# Patient Record
Sex: Female | Born: 1940 | Race: Black or African American | Hispanic: No | Smoking: Former smoker
Health system: Southern US, Community
[De-identification: ages and names within clinical notes are randomized; demographics above are authoritative.]

## PROBLEM LIST (undated history)

## (undated) DIAGNOSIS — M069 Rheumatoid arthritis, unspecified: Secondary | ICD-10-CM

## (undated) DIAGNOSIS — I10 Essential (primary) hypertension: Secondary | ICD-10-CM

## (undated) HISTORY — PX: TOTAL KNEE ARTHROPLASTY: SHX125

## (undated) HISTORY — PX: JOINT REPLACEMENT: SHX530

## (undated) HISTORY — PX: TOTAL HIP ARTHROPLASTY: SHX124

## (undated) HISTORY — PX: TOTAL SHOULDER REPLACEMENT: SUR1217

---

## 2001-09-20 ENCOUNTER — Ambulatory Visit (HOSPITAL_BASED_OUTPATIENT_CLINIC_OR_DEPARTMENT_OTHER): Admission: RE | Admit: 2001-09-20 | Discharge: 2001-09-20 | Payer: Self-pay | Admitting: Orthopedic Surgery

## 2002-05-29 ENCOUNTER — Encounter: Payer: Self-pay | Admitting: Orthopedic Surgery

## 2002-05-31 ENCOUNTER — Encounter: Payer: Self-pay | Admitting: Orthopedic Surgery

## 2002-05-31 ENCOUNTER — Inpatient Hospital Stay (HOSPITAL_COMMUNITY): Admission: RE | Admit: 2002-05-31 | Discharge: 2002-06-03 | Payer: Self-pay | Admitting: Orthopedic Surgery

## 2002-06-03 ENCOUNTER — Inpatient Hospital Stay (HOSPITAL_COMMUNITY)
Admission: RE | Admit: 2002-06-03 | Discharge: 2002-06-15 | Payer: Self-pay | Admitting: Physical Medicine & Rehabilitation

## 2002-06-07 ENCOUNTER — Encounter: Payer: Self-pay | Admitting: Physical Medicine & Rehabilitation

## 2002-06-07 ENCOUNTER — Encounter: Payer: Self-pay | Admitting: Orthopedic Surgery

## 2002-11-20 ENCOUNTER — Inpatient Hospital Stay (HOSPITAL_COMMUNITY): Admission: RE | Admit: 2002-11-20 | Discharge: 2002-11-22 | Payer: Self-pay | Admitting: Orthopedic Surgery

## 2002-11-20 ENCOUNTER — Encounter: Payer: Self-pay | Admitting: Orthopedic Surgery

## 2002-11-22 ENCOUNTER — Inpatient Hospital Stay (HOSPITAL_COMMUNITY)
Admission: RE | Admit: 2002-11-22 | Discharge: 2002-12-03 | Payer: Self-pay | Admitting: Physical Medicine & Rehabilitation

## 2008-08-20 ENCOUNTER — Other Ambulatory Visit: Admission: RE | Admit: 2008-08-20 | Discharge: 2008-08-20 | Payer: Self-pay | Admitting: Internal Medicine

## 2008-10-22 ENCOUNTER — Emergency Department (HOSPITAL_COMMUNITY): Admission: EM | Admit: 2008-10-22 | Discharge: 2008-10-22 | Payer: Self-pay | Admitting: Emergency Medicine

## 2009-01-29 ENCOUNTER — Ambulatory Visit (HOSPITAL_BASED_OUTPATIENT_CLINIC_OR_DEPARTMENT_OTHER): Admission: RE | Admit: 2009-01-29 | Discharge: 2009-01-29 | Payer: Self-pay | Admitting: Orthopedic Surgery

## 2010-05-10 LAB — POCT I-STAT, CHEM 8
BUN: 15 mg/dL (ref 6–23)
Chloride: 107 mEq/L (ref 96–112)
Creatinine, Ser: 0.8 mg/dL (ref 0.4–1.2)
Potassium: 4 mEq/L (ref 3.5–5.1)
Sodium: 143 mEq/L (ref 135–145)

## 2010-05-14 LAB — POCT I-STAT, CHEM 8
BUN: 14 mg/dL (ref 6–23)
Chloride: 108 mEq/L (ref 96–112)
Creatinine, Ser: 1.2 mg/dL (ref 0.4–1.2)
Glucose, Bld: 103 mg/dL — ABNORMAL HIGH (ref 70–99)
Hemoglobin: 12.9 g/dL (ref 12.0–15.0)
Potassium: 4 mEq/L (ref 3.5–5.1)
Sodium: 137 mEq/L (ref 135–145)

## 2010-06-25 NOTE — Discharge Summary (Signed)
Sandra Torres, HITSMAN                            ACCOUNT NO.:  [id number]   MEDICAL RECORD NO.:  [id number]                   PATIENT TYPE:  IPS   LOCATION:  4155                                 FACILITY:  [facility]   PHYSICIAN:  Erick Colace, M.D.           DATE OF BIRTH:  10/21/1940   DATE OF ADMISSION:  06/03/2002  DATE OF DISCHARGE:  06/15/2002                                 DISCHARGE SUMMARY   DISCHARGE DIAGNOSES:  1. 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  2. Iatrogenics femoral shaft fraction.  3. History of rheumatoid arthritis.  4. Anemia.  5. Deep vein thrombosis prophylaxis.   HISTORY OF PRESENT ILLNESS:  The patient is a 70 year old black female with  past history of total hip replacement and RA admitted on 05/31/02 for  revision with ORIF of the femur secondary to loose left total hip  replacement with iatrogenic femoral shaft fracture by Dr. Mckinley Jewel.   She was placed on Coumadin for DVT prophylaxis.  Postoperative complications  includes constipation and anemia status post transfusion.  PT report at this  time indicates she is ambulating five steps with minimal assist and can  transfer with moderate assist.  She is now weightbearing on the left leg.  The patient was transferred to [facility] inpatient rehabilitation  department on 06/03/02.   PAST MEDICAL HISTORY:  Significant for as above, hypertension.   PAST SURGICAL HISTORY:  Left total knee arthroplasty and left total shoulder  placement.   PRIMARY CARE PHYSICIAN:  Areatha Keas, M.D. and Demetria Pore. Levitin, M.D.   SOCIAL HISTORY:  The patient lives in a one level home with niece and 2-3  steps to entry.  Independent prior to admission.  Divorced and retired.  She  has limited family report.  Absent tobacco and alcohol use.   FAMILY HISTORY:  Noncontributory.   HOSPITAL COURSE:  Mrs. Sandra Torres was admitted to [facility]  rehabilitation department on 06/03/02 for  comprehensive inpatient  rehabilitation.  She received more than 3 hours of therapy daily.  1. 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     Overall, the patient made pretty good progress during her 11-day stay in     rehabilitation.  She was discharged at a modified independent level.  The     patient was placed on Coumadin for DVT prophylaxis.  Due to decrease in     hemoglobin and positive stools, Coumadin was discontinued on 06/06/02.     She was placed on Lovenox 40 mg and 30 mg SV q.12h.  She remained on the     Lovenox throughout her entire stay in rehabilitation.  She had venous     Dopplers performed on 06/10/02 that demonstrated no evidence of DVT,     __________ or Baker cyst.  In the first few days of rehabilitation, she     still  had significant blood drainage from the surgery incision.  There     was a question as to whether there was a hematoma at the site.  The     patient was followed throughout by orthopedics, and they recommended     doing a CT of the thigh and pelvis to rule out hematoma and also     recommended do x-rays.  X-rays performed on 06/07/02 demonstrated no acute     abnormality involving the femur.  The patient had pelvic CT performed on     06/07/02 which demonstrated no evidence of pelvic hematoma or free pelvic     fluid.  There are two screws associated with the acetabular compartment     of the left prosthesis which extended to the left iliac muscle.  The     patient complained of significant pain.  She used Oxycodone and Tylenol     as needed.  The patient also was started on OxyContin q.12h. on 4/27 to     help with pain relief.  The patient also received a K-pad to her legs.     The patient also had significant edema in her lower extremities.  The     patient had specific hose ordered.  She had left thigh-high SOBS, 25 mmHg     and that did decrease the swelling in her legs.  Staples were removed and     Steri-Strips were applied on  06/14/02.  At that time, there was decrease in     the drainage and no signs of infection.  2. Anemia.  The patient remained on Trinsicon p.o. b.i.d. throughout her     entire stay in rehabilitation.  Due to hemoglobin of less than 8 on 4/24,     the patient was transfused two units packed red blood cells.  The patient     tolerated the procedure well.  Hemoglobin remained stable throughout     rehab stay.  3. History of rheumatoid arthritis, on methotrexate 7.5 mg p.o. Monday with     prednisone 10 mg p.o. daily.  The patient also occasionally had muscle     spasms, and she received Flexeril 10 mg p.o. q.8h. p.r.n. Flexeril was     discontinued on 06/09/02, and she was discharged on Robaxin.  On Robaxin,     the patient had better relief.  4. History of hypertension.  Blood pressure remained fairly stable     throughout rehabilitation.  No adjustment was made to her Norvasc of 10     mg daily.  5. Other major medical issues occurred in rehabilitation.   LABORATORY DATA:  Latest hemoglobin was 9.9, hematocrit 30.5, white blood  cell count 8.8, platelet 445,000.  The patient had one hemoccult performed  on three stools and there was one positive and one negative.  She remained  on Carafate throughout her entire stay from rehabilitation.  Latest sodium  143, potassium 4.0, chloride 106, CO2 of 29, glucose 88, BUN 9, creatinine  0.9.  AST 35, ALT 23, alkaline phosphatase 62.  Latest urine culture  performed on 06/12/02 20,000 colonies of diphtheroid noted.   At the time of discharge, all vitals were stable.  The patient is ambulating  approximately 125 feet modified independent level, rolling walker,  nonweightbearing on the left.  She is discharged on nonweightbearing status.  She was transfer sit to stand modified independent and in bed mobility  modified independence.  Perform ADL with additional modified independent  level.  The patient met all goals and was discharged home with family.   At the time  of discharge surgical incision without infection, now drainage, no signs of  infection.   DISCHARGE MEDICATIONS:  1. Trinsicon one tablet b.i.d.  2. Prednisone 10 mg daily.  3. Norvasc 10 mg daily.  4. OxyContin 10 mg follow taper.  5. Carafate.  6. Resume on dose of Biaxin 500 one tablet q.6h. as needed for spasm.  7. Oxycodone 5-10 mg q.4-6h. p.r.n. pain.  8. Lovenox injection 40 mg SQ four x4 days.  Have nursing instruct patient     how to administer injections.  9. She will have pain management with Tylenol, Oxycodone and OxyContin.   ACTIVITY:  Nonweightbearing on the left leg.  Use rolling walker.  No  drinking, no driving.  Wear compression stockings when up and walking.  Discharge to [facility] for PT/OT RN.  RN to administer injection  of Lovenox once daily.  Instruct the patient how to administer injection of  Lovenox once daily.  See Dr. Reuel Boom in two weeks.  Follow up with Dr. Phylliss Bob  in four weeks.  Follow up with Dr. Wynn Banker as needed.     Junie Bame, P.A.                       Erick Colace, M.D.    LH/MEDQ  D:  07/09/2002  T:  07/09/2002  Job:  [id number]   cc:   Erick Colace, M.D.  [address] [place]  [place] [address]  Fax: [phone]   Loreta Ave, M.D.  [address]  [place]  [place] [address]  Fax: [phone]   Areatha Keas, M.D.  [address]  [address]  [place]  [place] [address]  Fax: [phone]   Demetria Pore. Coral Spikes, M.D.  [address]  [address]  [place]  [place] [address]  Fax: [phone]

## 2010-06-25 NOTE — Discharge Summary (Signed)
NAMEMELIZZA, Sandra Torres                            ACCOUNT NO.:  [id number]   MEDICAL RECORD NO.:  [id number]                   PATIENT TYPE:  IPS   LOCATION:  4142                                 FACILITY:  [facility]   PHYSICIAN:  Ranelle Oyster, M.D.             DATE OF BIRTH:  12/12/1940   DATE OF ADMISSION:  11/22/2002  DATE OF DISCHARGE:  12/03/2002                                 DISCHARGE SUMMARY   DISCHARGE DIAGNOSES:  1. 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  2. Pain management.  3. Anemia.  4. Coumadin for deep venous thrombosis prophylaxis.  5. Rheumatoid arthritis.  6. Hypertension.  7. History of a left total hip replacement with revision in 2004.  8. Left total knee replacement.   HISTORY OF PRESENT ILLNESS:  A 70 year old female with history of rheumatoid  arthritis admitted November 20, 2002, with end-stage rheumatoid arthritis of  the right knee and no relief with conservative care.  She underwent a right  total knee replacement November 20, 2002, per Dr. Eulah Pont.  Placed on Coumadin  for deep venous thrombosis prophylaxis, weightbearing as tolerated.  Postoperative pain management with intravenous Dilaudid.  No chest pain or  shortness of breath, minimal assist for transfers.  Supervision ambulation.  Latest chemistries unremarkable.  Admitted for a comprehensive rehab  program.   PAST MEDICAL HISTORY:  See discharge diagnoses.   PAST SURGICAL HISTORY:  Left ulnar nerve decompression.  Left shoulder  surgery and multiple joint replacement secondary to rheumatoid arthritis.   PRIMARY CARE PHYSICIAN:  Areatha Keas, M.D.   ALLERGIES:  No known drug allergies.   HABITS:  Remote smoker, no alcohol.   MEDICATIONS PRIOR TO ADMISSION:  1. Methotrexate weekly.  2. Norvasc 10 mg daily.  3. Carafate 1 g twice daily.  4. Prednisone 10 mg daily.  5. Tylox as needed.  6. Multivitamins.   SOCIAL HISTORY:  [place]ives [place]  Independent prior to admission.  One level home, three steps to entry.  Niece works day shift.  Sister is [place], [place].   HOSPITAL COURSE:  The patient did well while in rehabilitation services with  therapies initiated on a b.i.d. basis.  The following issues were followed  during the patient's rehab course.  Pertaining to Ms. Walkup' right total  knee replacement, surgical site healing nicely, no signs of infection,  weightbearing as tolerated.  She would follow up with Dr. Eulah Pont.  She was  ambulating supervision with a walker with home health therapies arranged.  Pain management ongoing with the addition of OxyContin and titrated  accordingly to 30 mg every 12 hours.  This would be tapered on her  discharge.  Anemia remained stable on iron supplement.  Latest hemoglobin  9.4, hematocrit 27.4.  There were no bleeding episodes.  She remained on  Coumadin  for deep venous thrombosis prophylaxis.  Latest INR of 2.9.  She  would complete Coumadin protocol.  Her rheumatoid arthritis was followed by  rheumatologist, Dr. Phylliss Bob.  She continued on prednisone 10 mg daily, as well  as weekly methotrexate.  She had no bowel or bladder disturbances.  Overall,  she was minimal assist supervision level for all areas of activities of  daily living, ambulating with a rolling walker.  She would be discharged to  home.   DISCHARGE MEDICATIONS:  1. Coumadin with latest dose of 4 mg adjusted accordingly to be completed on     December 21, 2002.  2. Norvasc 10 mg daily.  3. Trinsicon twice daily.  4. Methotrexate 7.5 every Monday.  5. Carafate 1 g twice daily.  6. Os-Cal twice daily.  7. Prednisone 10 mg daily.  8. OxyContin continued release 10 mg three tablets every 12 hours x1 week,     taper to two tablets every 12 hours x1 week to one tablet every 12 hours     x1 week and discontinue.  9. Tylox as needed for pain.   ACTIVITY:  Weightbearing as  tolerated.   DIET:  Regular.   WOUND CARE:  Clean incision daily with warm soap and water.    SPECIAL INSTRUCTIONS:  Home health nurse to complete Coumadin protocol.  She  should follow up with Dr. Eulah Pont, orthopedic services, Dr. Phylliss Bob,  rheumatology.      Mariam Dollar, P.A.                     Ranelle Oyster, M.D.    DA/MEDQ  D:  12/02/2002  T:  12/02/2002  Job:  [id number]   cc:   Areatha Keas, M.D.  [address]  [address]  [place]  [place] [address]  Fax: [phone]   Loreta Ave, M.D.  [address]  [place] [address]  Fax: [phone]

## 2010-06-25 NOTE — Op Note (Signed)
NAMELASHENA, SIGNER                            ACCOUNT NO.:  [id number]   MEDICAL RECORD NO.:  [id number]                   PATIENT TYPE:  INP   LOCATION:  2550                                 FACILITY:  [facility]   PHYSICIAN:  Loreta Ave, M.D.              DATE OF BIRTH:  10-25-1940   DATE OF PROCEDURE:  05/31/2002  DATE OF DISCHARGE:                                 OPERATIVE REPORT   PREOPERATIVE DIAGNOSIS:  Severe rheumatoid arthritis with osteoporosis.  Status post left total hip replacement with aseptic loosening and extensive  bone loss on both femoral and acetabular side.   POSTOPERATIVE DIAGNOSIS:  Severe rheumatoid arthritis with extreme  osteoporosis. Status post left total hip replacement with aseptic loosening  and extensive bone loss on both femoral and acetabular side with extreme  osteoporosis.   OPERATION/PROCEDURE:  1. Removal of previous acetabular and femoral implants as well as cement.  2. Revision to an Osteonics prosthesis, 56 mm Trident PSL cup, screw     fixation x2 with acetabular augmentation with allograft femoral head and     platelet enriched cancellous bone, 36 mm internal diameter polyethylene     component, 20-degree overhang.  3. Revision of femur to a fully HA coated restoration femoral stem, size #9     with a 15 mm distal tip, 8-inch, calcar replacing stem, 36 mm metallic     ball plus 0.  4. Cerclage wiring of previously present greater trochanter fracture as well     as a spiral fracture of the femur that occurred during attempted removal     and reinsertion of new prosthesis.  Dall-Miles cables x4.   SURGEON:  Loreta Ave, M.D.   ASSISTANT:  Arlys John D. Petrarca, P.A.-C.   ANESTHESIA:  General.   ESTIMATED BLOOD LOSS:  600 ml.   BLOOD GIVEN:  None.   SPECIMENS:  Excised bone, soft tissue and removed implants.   CULTURES:  None.   COMPLICATIONS:  None.   DRESSING:  Soft compressive with abduction pillow.   DESCRIPTION OF  PROCEDURE:  The patient was brought to the operating room and  placed on the operating table in the supine position.  After adequate  anesthesia had been obtained, turned to a lateral position, left side up.  Prepped and draped in the usual sterile fashion.  An incision along the femur extending posterior superior utilizing her  previous incision and extending this proximally and distally.  Skin and  subcutaneous tissue divided.  The iliotibial band exposed, incised. Charnley  retractor put in place.  Marked, blackened, reactive tissue from metal on  metal wear.  All of this excised.  External rotator and capsule taken down  off the back of the intertrochanteric groove of the femur, preserving this  as much as possible.  The trochanter was already broken but still attached  with soft tissue with the cracking extending down  below the level of the  lesser trochanter.  Bony erosion all the way down to the lesser trochanter  on the femur as well.  After extensive removal of the inflammatory debris,  metal reactive debris and scar tissue, I was able to expose both components  and both components were removed without too much difficulty.  Surrounding  cement in the acetabulum and femur was sequentially removed.  The femur was  extremely osteoporotic and because of loosening and cement reaction, there  were areas where it was essentially tapered thin in the shaft.  Attention  was turned to the acetabulum.  I reamed this up just enlarging it to remove  all of the inflammatory and metallic debris and exposed bleeding bone  throughout.  The medial wall had no bone at all and was just soft tissue.  Superiorly the bone was also very deficient.  Reasonable roof and anterior  column in the relatively deficient posterior column.  Once that had been  prepared and sized approximately for a 56 mm component, attention was turned  to the femur, carefully reaming distally, breaking up the cement plug in all   cement as much as possible.  Eventually I sized for a Tel co replacing an 8-  inch long #9, 15 mm diameter restoration prosthesis.  With this I got good  restoration of her femoral anteversion and good capturing of the prosthesis  distally, confirmed visually as well as with fluoroscopic guidance.  During  one of the attempts of maneuvering and gentle reduction of femur, she  developed a spiral fracture in the area of very thin bone in the shaft from  previous cement disease.  This was treated with three Cerclage wires and  also an additional Cerclage wire was placed over the base of the  trochanteric fracture.  This yielded reasonable fixation of those  components.  The femoral trial was left in the femur and attention was  turned to the acetabulum.  I used cancellous and cortical graft from the  femoral head, packed into the roof of the acetabulum and then injected the  platelet enriched cancellous graft as well.  The 56 mm cup was then hammered  into place with some degree of capturing.  It was further enhanced with two  screws placed through this cup, both 45-50 mm long.  With this I had good  coverage superiorly, anteriorly and at least more than half posteriorly.  The quality of the ball was extremely poor throughout but I did have  reasonable capturing of fixation of the metallic liner.  A 20-degree  polyethylene insert was placed in the over right placed posterior superior.  Attention was turned back to femur.  Completion of the reaming was done.  The trial had been removed and the definitive component was hammered down  and placed in the femur seating it well on the calcar with the calcar  replacing prosthesis, restoring femoral anteversion.  All of the cables had  been firmly tightened down and the combination of the intramedullary implant  and cables to be good fixation and stability.  Further #5 fiber wire sutures were placed through the trochanter and through the prosthesis to  further  anchor the top of the trochanter down to the prosthesis itself.  After  appropriate trials, a plus 0 36 mm head was attached to the femur and it was  reduced in the acetabulum.  Good restoration of leg lengths and not too  tight in extension.  Good stability effect  in that extension.  The wound was  irrigated.  The #5 fiber wire that had been passed through the trochanter  were passed through the hole in the prosthesis and then firmly tied down in  a loop manner to hold the trochanter in place.  The fiber wire suture that  had been placed in the capsule and external rotators was then passed through  drill holes in the trochanter and over tied them to the trochanter as well.  We got final fluoroscopic views of the entire prosthesis, both acetabular  and femur with good alignment, fixation and fitting.  The wound was  irrigated.  Charnley retractor removed.  The iliotibial band was closed with  #1 Vicryl, skin and subcutaneous tissue with Vicryl and staples.  Margins  were injected with Marcaine.  Sterile compressive dressing applied, returned  to the supine position.  Abduction pillow placed.  Anesthesia reversed.  Brought to the recovery room.  Tolerated surgery well.  No complications.                                                 Loreta Ave, M.D.    DFM/MEDQ  D:  05/31/2002  T:  06/02/2002  Job:  [id number]

## 2010-06-25 NOTE — Op Note (Signed)
NAMESHELLI, PORTILLA                            ACCOUNT NO.:  [id number]   MEDICAL RECORD NO.:  [id number]                    PATIENT TYPE:   LOCATION:                                       FACILITY:   PHYSICIAN:  Loreta Ave, M.D.              DATE OF BIRTH:   DATE OF PROCEDURE:  09/20/2001  DATE OF DISCHARGE:                                 OPERATIVE REPORT   PREOPERATIVE DIAGNOSES:  1. Impingement, left shoulder.  2. Underlying rheumatoid arthritis.  3. End-stage degenerative changes, acromioclavicular joint.  4. Possible full-thickness rotator cuff tear.  5. Status post total shoulder replacement.   POSTOPERATIVE DIAGNOSES:  1. Left shoulder impingement with partial tearing, rotator cuff, above and     below.  No full-thickness tears.  2. Status post total shoulder replacement with intact components but with     some mobility and early loosening, glenoid component, but without     significant wear.  3. Grade 4 degenerative joint disease, acromioclavicular joint.  4. Deficient long head biceps tendon.   PROCEDURES:  1. Left shoulder examination under anesthesia, arthroscopy, with debridement     of intra-articular synovitis and inflammatory tissue and partial tearing,     undersurface rotator cuff.  2. Subacromial decompression with acromioplasty.  3. Excision distal clavicle.   SURGEON:  Loreta Ave, M.D.   ASSISTANT:  Arlys John D. Petrarca, P.A.-C.   ANESTHESIA:  General.   ESTIMATED BLOOD LOSS:  Minimal.   SPECIMENS:  None.   CULTURES:  None.   COMPLICATIONS:  None.   DRESSING:  Soft compressive.   DESCRIPTION OF PROCEDURE:  The patient brought to the operating room and  after anesthesia had been obtained, the left shoulder examined.  With the  exception of a little loss of internal and external rotation, she really had  quite good motion and good stability.  Placed in a beach chair position on  the shoulder positioner, prepped and draped in the  usual sterile fashion.  Three standard arthroscopic portals.  The anterior, posterior, lateral.  Shoulder entered with a blunt obturator, distended, and inspected.  Persistent hypertrophic inflammatory changes throughout the shoulder with  rice bodies.  All of this debrided.  Although there was extensive partial  tearing of the rotator cuff, there were no full-thickness tears.  The  humeral component looked excellent.  The glenoid component had some wear and  some mobility, but it was not grossly loose.  The long head of the biceps  tendon was never visualized within the shoulder.  Capsular and ligamentous  structures intact.  All inflammatory debris removed.  Cannula redirected  subacromially.  Typical impingement with a type 2 acromion.  The cuff  debrided and again no full-thickness tears.  Acromioplasty to a type 1  acromion with shaver and high-speed bur.  Hemostasis with cautery.  Distal  clavicle with grade 4 changes with inflammatory changes  there.  Lateral  centimeter was resected.  Adequacy of decompression and clavicle excision  confirmed viewing from all portals.  Instruments and fluid removed.  Portals, shoulder, and bursa injected with Marcaine.  Portals closed with 4-  0 nylon.  Sterile compressive dressing applied.  Anesthesia reversed after a  sling applied.  Brought to the recovery room.  Tolerated the surgery well  with no complications.                                                  Loreta Ave, M.D.    DFM/MEDQ  D:  09/20/2001  T:  09/23/2001  Job:  [id number]

## 2010-06-25 NOTE — Discharge Summary (Signed)
NAMECHEVELLA, Sandra Torres                            ACCOUNT NO.:  [id number]   MEDICAL RECORD NO.:  [id number]                   PATIENT TYPE:  INP   LOCATION:  5023                                 FACILITY:  [facility]   PHYSICIAN:  Loreta Ave, M.D.              DATE OF BIRTH:  [birth year]   DATE OF ADMISSION:  05/31/2002  DATE OF DISCHARGE:  06/03/2002                                 DISCHARGE SUMMARY   ADMISSION DIAGNOSIS:  Failed left total hip replacement.   DISCHARGE DIAGNOSES:  1. Failed left total hip replacement.  2. Iatrogenic fracture of the femur.   PROCEDURE:  Revision of the left total hip with ORIF.   HISTORY:  This 70 year old black female, status post left total hip  replacement in 1986 in [place].  She has been experiencing groin pain with  any activity.  Seen in our office with radiographic evidence of loosening of  the left acetabulum with positional change of the acetabular component.  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  She is  indicated at this time for revision, left total hip replacement.  She does  have a history of rheumatoid arthritis as well as osteoporosis.   HOSPITAL COURSE:  A 70 year old black female admitted May 31, 2002, and  after appropriate laboratory studies were obtained as well as 1 gm of Ancef  IV and called the operator and was taken to the operating room where she  underwent a revision of the left total hip replacement with open reduction  internal fixation of the femur.  She tolerated the procedure well.   She was continued postoperatively on Ancef 1 gm IV q.8h. x4 doses, placed on  heparin 5,000 units subcu q.12h. until Coumadin was therapeutic.   Consultations with PT/OT and rehab were made.  Total hip protocol was  instructed, nonweightbearing on her left leg in physical therapy.   Dr. Phylliss Bob was consulted for care also.   PCA pump was used for post-op pain management.  A Foley was placed  intraoperatively.   She was allowed out of the bed to the chair the following day.   She was typed and crossed for 2 units of packed cells on April 25 and was  given one on April 25 and one on April 26.  The Foley was discontinued on  April 26.   The remainder of her hospital course was uneventful, and she was transferred  to rehab on April 26 to continue with her physical and occupational therapy.   Radiographic studies, May 31, 2002, of the hip reveals good position of  the left total hip prosthesis.  No definite acute fracture.  There is a  slight irregularity around the medial aspect of the cortex of the mid  portion of the femoral shaft component of the prosthesis.  There are also  wire bands in the  upper femur.   Laboratory studies revealed admission hemoglobin of 13.5, hematocrit 40.8%,  white count 11,900, platelets 277,000.  Hemoglobin on April 26 was 7.5 and  hematocrit 21.9% with a white count of 21,500 and platelets of 152,000.  ProTime on April 26 revealed 21.8 with an INR of 2.2.  Pre-op chemistries:  Sodium 137, potassium 3.1, chloride 102, CO2 29, glucose 103, BUN 14,  creatinine 1.2, calcium 9.8; total protein was 7.3, albumin 3.8.  AST  28,  ALT 23, ALP 86 and total bilirubin 0.7.  Discharge chemistries:  Sodium 140,  potassium 3.4, chloride 110, CO2 25, glucose 89, BUN 12, creatinine 0.8,  calcium 8.6.  Urinalysis revealed trace ketones, protein of 30; leukocyte  esterase was small, white cells 3-6, no red cells seen, and there was  calcium oxylate crystals noted.  Blood type was O+, antibody screen  negative.   Transfused 2 units of packed cells during her hospital course.   DISCHARGE INSTRUCTIONS:  She was transferred to rehab in improved condition  on a house diet.  She will continue with her physical and occupational  therapy at that time.       Oris Drone Petrarca, P.A.-C.                Loreta Ave, M.D.    BDP/MEDQ  D:  07/01/2002  T:   07/01/2002  Job:  [id number]

## 2010-06-25 NOTE — Op Note (Signed)
Sandra Torres, Sandra Torres                            ACCOUNT NO.:  [id number]   MEDICAL RECORD NO.:  [id number]                   PATIENT TYPE:  INP   LOCATION:  2550                                 FACILITY:  [facility]   PHYSICIAN:  Loreta Ave, M.D.              DATE OF BIRTH:  1940/05/11   DATE OF PROCEDURE:  11/20/2002  DATE OF DISCHARGE:                                 OPERATIVE REPORT   PREOPERATIVE DIAGNOSIS:  End-stage rheumatoid arthritis, right knee.   POSTOPERATIVE DIAGNOSIS:  End-stage rheumatoid arthritis, right knee, with  underlying osteoporosis.   PROCEDURE:  Right total knee replacement with [employer]ics prosthesis, soft  tissue balancing.  A cemented #7 posterior stabilized femoral component,  cemented #7 tibial component with a 10 mm posterior stabilized Flex insert,  with a cemented recessed 26 mm patellar component.   SURGEON:  Loreta Ave, M.D.   ASSISTANT:  Arlys John D. Petrarca, P.A.-C.   ANESTHESIA:  General.   ESTIMATED BLOOD LOSS:  Minimal.   TOURNIQUET TIME:  One hour.   SPECIMENS:  Bone and soft tissue.   CULTURES:  None.   COMPLICATIONS:  None.   DRESSING:  Soft compressive, knee immobilizer.   DRAINS:  Hemovac x2.   DESCRIPTION OF PROCEDURE:  The patient is brought to the operating room, and  after the adequate anesthesia had been obtained, the right knee was  examined.  Motion was 5 to about 80 degrees.  Stable ligaments.  Alignment  was straight.  The tourniquet applied.  Prepped and draped in the usual  sterile fashion.  Exsanguination with elevation of the Esmarch.  The  tourniquet was inflated to 350 mmHg.  A straight incision above the patella,  down to the tibial tubercle.  A medial arthrotomy.  Hemostasis with cautery.  The remaining thickened chronic synovium was resected throughout.  Remnants  of menisci and cruciate ligaments resected.  The distal femur was exposed.  The intramedullary guide was placed.  A moderate osteoporosis.   The distal  resection 10 mm, set at 5 degrees of valgus.  Sized for a #7 component.  Jigs put in place and definitive cuts made.  The trial put in place and  found to fit well.  The tibia exposed.  The tibial spine removed with a saw.  Intramedullary guide placed.  The proximal cut removing 4.0 mm off the  deepest defect with the 5-degree posterior slope cut.  Sized to a #7  component.  The patella sized and reamed and drilled for a 26 mm component.  The trials put in place throughout.  With the 10 cm insert, extension and  flexion to 120 degrees.  Stable ligaments and reasonable patellofemoral  tracking.  The tibia was marked for rotation and reamed.  All trials were  removed.  Copious irrigation with the pulse irrigating device.  All recesses  examined and all spurs and loose bodies removed.  Cement prepared and placed  on all components which were firmly seated.  The polyethylene attacked to  the tibia.  The knee was reduced.  Once the cement had hardened, the knee  was re-examined.  Good extension and flexion to more than 120 degrees.  Good  patellofemoral tracking and stable ligaments.  A Hemovac was placed and  brought out through a separate stab wound.  The arthrotomy closed with #1  Vicryl sutures.  The skin and subcutaneous tissue were closed with Vicryl  and staples.  The margins of the wound of the knee were injected with  Marcaine.  A sterile compressive dressing was applied.  The tourniquet  deflated and removed.  The knee immobilizer applied.  Anesthesia was  reversed.  The patient was brought to the recovery room.  She tolerated the surgery  well with no complications.                                                Loreta Ave, M.D.    DFM/MEDQ  D:  11/20/2002  T:  11/20/2002  Job:  [id number]

## 2010-06-25 NOTE — Discharge Summary (Signed)
Sandra Torres, Sandra Torres                            ACCOUNT NO.:  [id number]   MEDICAL RECORD NO.:  [id number]                   PATIENT TYPE:  INP   LOCATION:  5010                                 FACILITY:  [facility]   PHYSICIAN:  Loreta Ave, M.D.              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   DISCHARGE DIAGNOSES:  1. 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  2. History of rheumatoid arthritis.  3. History of hypertension.  4. History of osteoporosis.  5. Status post total hip replacement with revision.   PROCEDURE:  Right total knee replacement.   HISTORY:  A 70 year old black female with progressive pain and discomfort of  the right knee secondary to end-stage changes from rheumatoid arthritis.  She is now four months status post revision left total hip replacement, and  her right knee has become quite symptomatic and she would like to consider  total knee replacement.  Because of her failure with conservative treatment  and persistent pain with activities of daily living she is now indicated for  a total knee replacement on the right.   HOSPITAL COURSE:  This 70 year old black female was admitted on November 20, 2002.  After appropriate laboratory studies were obtained as well as 1 g  Ancef IV on call to the operating room, was taken to the operating room  where she underwent a right total knee replacement.  She tolerated the  procedure well.  Continued on Ancef 1 g IV q.8h. x 3 doses.  Heparin 5000  units subcutaneous q.12h. was started until her Coumadin became therapeutic.  She was placed on a low dose Dilaudid PCA pump.  Consultations with PT, OT,  and rehab were made.  She was allowed to ambulate weightbearing as tolerated  on the right.  CPM 0 to 30 degrees for eight  hours per day, increasing daily  by 10 degrees.  She was allowed up bed to chair the following day.  She had  her dressing changed on postoperative day #2, and her Foley was  discontinued.  Hemovac's were pulled.  Her wound was clean and dry.  She did  very well with an unremarkable hospital course and was discharged to rehab  on November 22, 2002.   RADIOGRAPHIC STUDIES:  On November 20, 2002, of the right knee reveals  successful right knee replacement.   LABORATORY DATA:  Admitted with a hemoglobin of 13.8, hematocrit 39.3, white  count 10,700, and platelets 301,000.  Discharge hemoglobin 9.7, hematocrit  29%, white count 13,800, platelets 252,000.  Prothrombin time at the time of  discharge is 14.1, INR of 1.1.  Preoperative chemistries:  Sodium 139,  potassium 3.8, chloride 103, CO2 28, glucose 106,  BUN 14, creatinine 0.9,  calcium 9.9, total protein 6.9, albumin 3.8, AST 31, ALT 23, ALP 103, and  total bilirubin 0.5.  Discharge sodium 141, potassium 4.5, chloride 104, CO2  29, glucose 129, BUN 16, creatinine 1, calcium 8.  Urinalysis revealed 30  protein, trace ketones, one urobilinogen, with small leukocyte esterase with  3 to 6 white cells, rare epithelial.  Hyaline casts were noted as well as  calcium __________ crystals.  No bacteria was seen.  Blood type was O  positive, antibody screen negative.   DISCHARGE INSTRUCTIONS:  1. She will continue with her physical therapy and occupational therapy in     rehab.  She was discharged in improved condition.  2. Follow back in the office in 10 to 14 days for staple removal.   CONDITION ON DISCHARGE:  Discharged in improved condition.      Oris Drone Petrarca, P.A.-C.                Loreta Ave, M.D.    BDP/MEDQ  D:  12/26/2002  T:  12/27/2002  Job:  [id number]

## 2011-03-16 DIAGNOSIS — Z79899 Other long term (current) drug therapy: Secondary | ICD-10-CM | POA: Diagnosis not present

## 2011-03-16 DIAGNOSIS — M069 Rheumatoid arthritis, unspecified: Secondary | ICD-10-CM | POA: Diagnosis not present

## 2011-03-16 DIAGNOSIS — M25549 Pain in joints of unspecified hand: Secondary | ICD-10-CM | POA: Diagnosis not present

## 2011-07-20 DIAGNOSIS — M069 Rheumatoid arthritis, unspecified: Secondary | ICD-10-CM | POA: Diagnosis not present

## 2011-09-23 DIAGNOSIS — E039 Hypothyroidism, unspecified: Secondary | ICD-10-CM | POA: Diagnosis not present

## 2011-09-23 DIAGNOSIS — Z79899 Other long term (current) drug therapy: Secondary | ICD-10-CM | POA: Diagnosis not present

## 2011-09-23 DIAGNOSIS — I10 Essential (primary) hypertension: Secondary | ICD-10-CM | POA: Diagnosis not present

## 2011-09-28 DIAGNOSIS — M069 Rheumatoid arthritis, unspecified: Secondary | ICD-10-CM | POA: Diagnosis not present

## 2011-09-28 DIAGNOSIS — G4700 Insomnia, unspecified: Secondary | ICD-10-CM | POA: Diagnosis not present

## 2011-09-28 DIAGNOSIS — I10 Essential (primary) hypertension: Secondary | ICD-10-CM | POA: Diagnosis not present

## 2011-09-28 DIAGNOSIS — M899 Disorder of bone, unspecified: Secondary | ICD-10-CM | POA: Diagnosis not present

## 2011-11-04 DIAGNOSIS — Z23 Encounter for immunization: Secondary | ICD-10-CM | POA: Diagnosis not present

## 2011-11-22 DIAGNOSIS — M069 Rheumatoid arthritis, unspecified: Secondary | ICD-10-CM | POA: Diagnosis not present

## 2012-01-13 DIAGNOSIS — Z1231 Encounter for screening mammogram for malignant neoplasm of breast: Secondary | ICD-10-CM | POA: Diagnosis not present

## 2012-02-22 DIAGNOSIS — M069 Rheumatoid arthritis, unspecified: Secondary | ICD-10-CM | POA: Diagnosis not present

## 2012-06-03 DIAGNOSIS — L723 Sebaceous cyst: Secondary | ICD-10-CM | POA: Diagnosis not present

## 2012-06-07 DIAGNOSIS — M069 Rheumatoid arthritis, unspecified: Secondary | ICD-10-CM | POA: Diagnosis not present

## 2012-10-11 DIAGNOSIS — R109 Unspecified abdominal pain: Secondary | ICD-10-CM | POA: Diagnosis not present

## 2012-10-11 DIAGNOSIS — R197 Diarrhea, unspecified: Secondary | ICD-10-CM | POA: Diagnosis not present

## 2012-10-15 DIAGNOSIS — M069 Rheumatoid arthritis, unspecified: Secondary | ICD-10-CM | POA: Diagnosis not present

## 2012-10-22 DIAGNOSIS — Z23 Encounter for immunization: Secondary | ICD-10-CM | POA: Diagnosis not present

## 2012-10-22 DIAGNOSIS — R197 Diarrhea, unspecified: Secondary | ICD-10-CM | POA: Diagnosis not present

## 2013-01-17 DIAGNOSIS — Z1231 Encounter for screening mammogram for malignant neoplasm of breast: Secondary | ICD-10-CM | POA: Diagnosis not present

## 2013-02-14 DIAGNOSIS — M069 Rheumatoid arthritis, unspecified: Secondary | ICD-10-CM | POA: Diagnosis not present

## 2013-02-27 ENCOUNTER — Inpatient Hospital Stay (HOSPITAL_COMMUNITY)
Admission: EM | Admit: 2013-02-27 | Discharge: 2013-03-02 | DRG: 153 | Disposition: A | Discharge status: Home / Self Care | Payer: Medicare Other | Attending: Internal Medicine | Admitting: Internal Medicine

## 2013-02-27 ENCOUNTER — Emergency Department (HOSPITAL_COMMUNITY): Payer: Medicare Other

## 2013-02-27 ENCOUNTER — Encounter (HOSPITAL_COMMUNITY): Payer: Self-pay | Admitting: Emergency Medicine

## 2013-02-27 DIAGNOSIS — N179 Acute kidney failure, unspecified: Secondary | ICD-10-CM | POA: Diagnosis not present

## 2013-02-27 DIAGNOSIS — J111 Influenza due to unidentified influenza virus with other respiratory manifestations: Principal | ICD-10-CM | POA: Diagnosis present

## 2013-02-27 DIAGNOSIS — E860 Dehydration: Secondary | ICD-10-CM | POA: Diagnosis not present

## 2013-02-27 DIAGNOSIS — D899 Disorder involving the immune mechanism, unspecified: Secondary | ICD-10-CM | POA: Diagnosis present

## 2013-02-27 DIAGNOSIS — IMO0002 Reserved for concepts with insufficient information to code with codable children: Secondary | ICD-10-CM | POA: Diagnosis not present

## 2013-02-27 DIAGNOSIS — R509 Fever, unspecified: Secondary | ICD-10-CM

## 2013-02-27 DIAGNOSIS — I951 Orthostatic hypotension: Secondary | ICD-10-CM | POA: Diagnosis present

## 2013-02-27 DIAGNOSIS — R918 Other nonspecific abnormal finding of lung field: Secondary | ICD-10-CM | POA: Diagnosis not present

## 2013-02-27 DIAGNOSIS — W19XXXA Unspecified fall, initial encounter: Secondary | ICD-10-CM | POA: Diagnosis present

## 2013-02-27 DIAGNOSIS — R5381 Other malaise: Secondary | ICD-10-CM | POA: Diagnosis present

## 2013-02-27 DIAGNOSIS — M6282 Rhabdomyolysis: Secondary | ICD-10-CM | POA: Diagnosis present

## 2013-02-27 DIAGNOSIS — W010XXA Fall on same level from slipping, tripping and stumbling without subsequent striking against object, initial encounter: Secondary | ICD-10-CM | POA: Diagnosis present

## 2013-02-27 DIAGNOSIS — R5383 Other fatigue: Secondary | ICD-10-CM

## 2013-02-27 DIAGNOSIS — I10 Essential (primary) hypertension: Secondary | ICD-10-CM | POA: Diagnosis present

## 2013-02-27 DIAGNOSIS — S0990XA Unspecified injury of head, initial encounter: Secondary | ICD-10-CM | POA: Diagnosis not present

## 2013-02-27 DIAGNOSIS — R6889 Other general symptoms and signs: Secondary | ICD-10-CM | POA: Diagnosis not present

## 2013-02-27 DIAGNOSIS — M069 Rheumatoid arthritis, unspecified: Secondary | ICD-10-CM | POA: Diagnosis present

## 2013-02-27 DIAGNOSIS — R9389 Abnormal findings on diagnostic imaging of other specified body structures: Secondary | ICD-10-CM

## 2013-02-27 DIAGNOSIS — Y92009 Unspecified place in unspecified non-institutional (private) residence as the place of occurrence of the external cause: Secondary | ICD-10-CM

## 2013-02-27 HISTORY — DX: Rheumatoid arthritis, unspecified: M06.9

## 2013-02-27 LAB — CBC WITH DIFFERENTIAL/PLATELET
BASOS ABS: 0 10*3/uL (ref 0.0–0.1)
BASOS PCT: 0 % (ref 0–1)
EOS PCT: 0 % (ref 0–5)
Eosinophils Absolute: 0 10*3/uL (ref 0.0–0.7)
HCT: 38.4 % (ref 36.0–46.0)
Hemoglobin: 12.8 g/dL (ref 12.0–15.0)
LYMPHS PCT: 10 % — AB (ref 12–46)
Lymphs Abs: 0.8 10*3/uL (ref 0.7–4.0)
MCH: 31.8 pg (ref 26.0–34.0)
MCHC: 33.3 g/dL (ref 30.0–36.0)
MCV: 95.3 fL (ref 78.0–100.0)
Monocytes Absolute: 0.4 10*3/uL (ref 0.1–1.0)
Monocytes Relative: 6 % (ref 3–12)
NEUTROS ABS: 6.3 10*3/uL (ref 1.7–7.7)
Neutrophils Relative %: 84 % — ABNORMAL HIGH (ref 43–77)
PLATELETS: 184 10*3/uL (ref 150–400)
RBC: 4.03 MIL/uL (ref 3.87–5.11)
RDW: 15.2 % (ref 11.5–15.5)
WBC: 7.5 10*3/uL (ref 4.0–10.5)

## 2013-02-27 LAB — URINALYSIS, ROUTINE W REFLEX MICROSCOPIC
Bilirubin Urine: NEGATIVE
GLUCOSE, UA: NEGATIVE mg/dL
LEUKOCYTES UA: NEGATIVE
NITRITE: NEGATIVE
PROTEIN: 100 mg/dL — AB
Specific Gravity, Urine: 1.022 (ref 1.005–1.030)
UROBILINOGEN UA: 0.2 mg/dL (ref 0.0–1.0)
pH: 6 (ref 5.0–8.0)

## 2013-02-27 LAB — BASIC METABOLIC PANEL
BUN: 18 mg/dL (ref 6–23)
CALCIUM: 9.3 mg/dL (ref 8.4–10.5)
CO2: 26 meq/L (ref 19–32)
Chloride: 97 mEq/L (ref 96–112)
Creatinine, Ser: 1.17 mg/dL — ABNORMAL HIGH (ref 0.50–1.10)
GFR calc non Af Amer: 45 mL/min — ABNORMAL LOW (ref >90)
GFR, EST AFRICAN AMERICAN: 52 mL/min — AB (ref >90)
Glucose, Bld: 87 mg/dL (ref 70–99)
Potassium: 4.2 mEq/L (ref 3.7–5.3)
SODIUM: 138 meq/L (ref 137–147)

## 2013-02-27 LAB — URINE MICROSCOPIC-ADD ON

## 2013-02-27 LAB — CK: CK TOTAL: 618 U/L — AB (ref 7–177)

## 2013-02-27 MED ORDER — ACETAMINOPHEN 325 MG PO TABS
650.0000 mg | ORAL_TABLET | Freq: Once | ORAL | Status: AC
  Administered 2013-02-27: 650 mg via ORAL
  Filled 2013-02-27: qty 2

## 2013-02-27 MED ORDER — SODIUM CHLORIDE 0.9 % IV BOLUS (SEPSIS)
1000.0000 mL | Freq: Once | INTRAVENOUS | Status: AC
  Administered 2013-02-28: 1000 mL via INTRAVENOUS

## 2013-02-27 NOTE — ED Notes (Signed)
Went in to review medical history with pt  Pt is unable to answer questions regarding past surgeries, etc  Pt alert to person, place   Pt appears to be having difficulty putting her thoughts into words  EDP notified

## 2013-02-27 NOTE — Progress Notes (Signed)
   CARE MANAGEMENT ED NOTE 02/27/2013  [name], Sandra Torres   Account Number:  [id number]  Date Initiated:  02/27/2013  Documentation initiated by:  Radford Pax  Subjective/Objective Assessment:   Patient presents to Ed post fall.  Also complains of frequent urination and urine having strong odor.     Subjective/Objective Assessment Detail:     Action/Plan:   Action/Plan Detail:   Anticipated DC Date:       Status Recommendation to Physician:   Result of Recommendation:    Other ED Services  Consult Working Plan    DC Planning Services  Other  PCP issues    Choice offered to / List presented to:            Status of service:  Completed, signed off  ED Comments:   ED Comments Detail:  Patient confirms she does not have a pcp but does have Medicare insurance.  EDCM provided patient with a list of pcps who accept Medicare insurance within a 10 nmile radius of her zip code.  Patient thankful for resources.

## 2013-02-27 NOTE — ED Provider Notes (Addendum)
CSN: [id number]     Arrival date & time 02/27/13  1946 History   First MD Initiated Contact with Patient 02/27/13 1951     Chief Complaint  Patient presents with  . Fall   (Consider location/radiation/quality/duration/timing/severity/associated sxs/prior Treatment) Patient is a 73 y.o. female presenting with fall. The history is provided by the patient.  Fall This is a new problem. The current episode started 6 to 12 hours ago. The problem occurs constantly. The problem has not changed since onset.Associated symptoms comments: States her legs gave out today and she fell and was unable to get up till her neighbor came home and found her.  She denies abd pain, HA, chest pain or SOB.  Pt has fever today but did not know she had fever.  She c/o of urinary frequency and strong odor to EMS but denies here.. The symptoms are aggravated by walking. Nothing relieves the symptoms. She has tried nothing for the symptoms. The treatment provided no relief.    Past Medical History  Diagnosis Date  . Rheumatoid arthritis    History reviewed. No pertinent past surgical history. No family history on file. History  Substance Use Topics  . Smoking status: Never Smoker   . Smokeless tobacco: Not on file  . Alcohol Use: No   OB History   Grav Para Term Preterm Abortions TAB SAB Ect Mult Living                 Review of Systems  Constitutional: Positive for fever.  Neurological: Positive for weakness.  Psychiatric/Behavioral: Positive for confusion.  All other systems reviewed and are negative.    Allergies  Review of patient's allergies indicates no known allergies.  Home Medications    BP 164/84  Pulse 98  Temp(Src) 102.9 F (39.4 C) (Oral)  Resp 19  SpO2 98% Physical Exam  Nursing note and vitals reviewed. Constitutional: She is oriented to person, place, and time. She appears well-developed and well-nourished. No distress.  HENT:  Head: Normocephalic and atraumatic.   Mouth/Throat: Oropharynx is clear and moist.  Eyes: Conjunctivae and EOM are normal. Pupils are equal, round, and reactive to light.  Neck: Normal range of motion. Neck supple.  Cardiovascular: Regular rhythm and intact distal pulses.  Tachycardia present.   No murmur heard. Pulmonary/Chest: Effort normal and breath sounds normal. No respiratory distress. She has no wheezes. She has no rales.  Abdominal: Soft. She exhibits no distension. There is no tenderness. There is no rebound and no guarding.  Musculoskeletal: Normal range of motion. She exhibits no edema and no tenderness.  Neurological: She is alert and oriented to person, place, and time. She has normal strength. No sensory deficit.  Pt can move all ext and normal sensation but some confusion on exam.  Will try to answer questions but has trouble finishing answers and does not make complete sense at times  Skin: Skin is warm and dry. No rash noted. No erythema.  Psychiatric: She has a normal mood and affect. Her behavior is normal.    ED Course  Procedures (including critical care time) Labs Review Labs Reviewed  CBC WITH DIFFERENTIAL - Abnormal; Notable for the following:    Neutrophils Relative % 84 (*)    Lymphocytes Relative 10 (*)    All other components within normal limits  BASIC METABOLIC PANEL - Abnormal; Notable for the following:    Creatinine, Ser 1.17 (*)    GFR calc non Af Amer 45 (*)    GFR  calc Af Amer 52 (*)    All other components within normal limits  URINALYSIS, ROUTINE W REFLEX MICROSCOPIC - Abnormal; Notable for the following:    Hgb urine dipstick SMALL (*)    Ketones, ur >80 (*)    Protein, ur 100 (*)    All other components within normal limits  CK - Abnormal; Notable for the following:    Total CK 618 (*)    All other components within normal limits  URINE MICROSCOPIC-ADD ON   Imaging Review Dg Chest 2 View  02/28/2013   CLINICAL DATA:  Fever.  Rheumatoid arthritis.  EXAM: CHEST  2 VIEW   COMPARISON:  None.  FINDINGS: Lateral view degraded by patient arm position. Right glenohumeral joint joint space narrowing. Left shoulder arthroplasty. Midline trachea. Mild cardiomegaly. Right paratracheal soft tissue fullness could be related to prominent great vessels. No pleural effusion or pneumothorax. Left upper lobe nodular density which measures 8 mm and is likely calcified.  IMPRESSION: No acute process or explanation for fever.  Favor scarring in the left upper lobe.  Right paratracheal soft tissue fullness which could be related to prominent great vessels. If there are prior radiographs for comparison, the should be reviewed. If no priors exist, followup with chest radiograph in 3 months is recommended.   Electronically Signed   By: Jeronimo Greaves M.D.   On: 02/28/2013 00:36   Ct Head Wo Contrast  02/28/2013   CLINICAL DATA:  Fall  EXAM: CT HEAD WITHOUT CONTRAST  TECHNIQUE: Contiguous axial images were obtained from the base of the skull through the vertex without intravenous contrast.  COMPARISON:  None available  FINDINGS: Mild age-related cerebral atrophy with moderate chronic microvascular ischemic changes are present. Prominent atherosclerotic calcifications present within the carotid siphons bilaterally.  There is no acute intracranial hemorrhage or infarct. No mass lesion or midline shift. Gray-white matter differentiation is well maintained. Ventricles are normal in size without evidence of hydrocephalus. CSF containing spaces are within normal limits. No extra-axial fluid collection.  The calvarium is intact.  Orbital soft tissues are within normal limits.  Air-fluid levels noted within the bilateral maxillary sinuses as well as the sphenoid sinuses. Scattered mucoperiosteal thickening present within the ethmoidal air cells. No mastoid effusion.  Scalp soft tissues are unremarkable.  IMPRESSION: 1. No acute intracranial abnormality. 2. Mild cerebral atrophy with moderate chronic microvascular  ischemic changes. 3. Air-fluid levels within the maxillary and sphenoid sinuses bilaterally, suggestive of active/acute sinusitis.   Electronically Signed   By: Rise Mu M.D.   On: 02/28/2013 00:16    EKG Interpretation   None       MDM   1. Fever of unknown origin   2. Dehydration   3. Rhabdomyolysis     Patient with a fall today was on the floor for 6 hours or more before she was found. Patient states she was just too weak to get up and she is mildly confused here and has a temperature of 102.9. She is otherwise hemodynamically stable. She did complain earlier that she had increased urination with a strong odor but currently denies any urinary symptoms, abdominal pain headache or other associated symptoms. Patient can move all extremities and low suspicion for stroke at this time however if her urine is normal patient will need a head CT and LP to rule out meningitis. She does have a history of rheumatoid arthritis and she is on methotrexate and prednisone.  CK was 618, CBC and BMP within normal  limits.  Patient given IV fluids and Tylenol.  11:44 PM uA without signs of UTI.  CXR and head CTpending.  12:48 AM CXR and CT neg for acute pathology.  Discussed LP with pt and at this time she is refusing LP.  Mental confusion has resolved now that fever has resolved.  Pt did get flu shot this year and flu pcr pending.  Will admit pt for IVF for dehydration and mild rhabdo.  Gwyneth Sprout, MD 02/28/13 [id number]  Gwyneth Sprout, MD 02/28/13 [phone]

## 2013-02-27 NOTE — ED Notes (Signed)
Per EMS pt fell earlier today and was found by her neighbor  Pt states she has not felt good for the past couple of days  Pt denies injury from fall  Pt states she has had urinary symptoms including increased urination and strong odor

## 2013-02-28 ENCOUNTER — Encounter (HOSPITAL_COMMUNITY): Payer: Self-pay | Admitting: Internal Medicine

## 2013-02-28 DIAGNOSIS — R918 Other nonspecific abnormal finding of lung field: Secondary | ICD-10-CM

## 2013-02-28 DIAGNOSIS — E860 Dehydration: Secondary | ICD-10-CM | POA: Diagnosis present

## 2013-02-28 DIAGNOSIS — M6282 Rhabdomyolysis: Secondary | ICD-10-CM | POA: Diagnosis not present

## 2013-02-28 DIAGNOSIS — I10 Essential (primary) hypertension: Secondary | ICD-10-CM | POA: Diagnosis present

## 2013-02-28 DIAGNOSIS — D899 Disorder involving the immune mechanism, unspecified: Secondary | ICD-10-CM | POA: Diagnosis not present

## 2013-02-28 DIAGNOSIS — R509 Fever, unspecified: Secondary | ICD-10-CM | POA: Diagnosis present

## 2013-02-28 DIAGNOSIS — N179 Acute kidney failure, unspecified: Secondary | ICD-10-CM | POA: Diagnosis not present

## 2013-02-28 DIAGNOSIS — W19XXXA Unspecified fall, initial encounter: Secondary | ICD-10-CM | POA: Diagnosis present

## 2013-02-28 DIAGNOSIS — J111 Influenza due to unidentified influenza virus with other respiratory manifestations: Secondary | ICD-10-CM | POA: Diagnosis not present

## 2013-02-28 DIAGNOSIS — R9389 Abnormal findings on diagnostic imaging of other specified body structures: Secondary | ICD-10-CM | POA: Diagnosis present

## 2013-02-28 DIAGNOSIS — S0990XA Unspecified injury of head, initial encounter: Secondary | ICD-10-CM | POA: Diagnosis not present

## 2013-02-28 LAB — COMPREHENSIVE METABOLIC PANEL
ALBUMIN: 3 g/dL — AB (ref 3.5–5.2)
ALK PHOS: 49 U/L (ref 39–117)
ALT: 21 U/L (ref 0–35)
AST: 49 U/L — ABNORMAL HIGH (ref 0–37)
BILIRUBIN TOTAL: 0.4 mg/dL (ref 0.3–1.2)
BUN: 17 mg/dL (ref 6–23)
CHLORIDE: 101 meq/L (ref 96–112)
CO2: 22 mEq/L (ref 19–32)
CREATININE: 1.08 mg/dL (ref 0.50–1.10)
Calcium: 8.1 mg/dL — ABNORMAL LOW (ref 8.4–10.5)
GFR calc non Af Amer: 49 mL/min — ABNORMAL LOW (ref >90)
GFR, EST AFRICAN AMERICAN: 57 mL/min — AB (ref >90)
GLUCOSE: 104 mg/dL — AB (ref 70–99)
POTASSIUM: 3.5 meq/L — AB (ref 3.7–5.3)
Sodium: 137 mEq/L (ref 137–147)
TOTAL PROTEIN: 6.6 g/dL (ref 6.0–8.3)

## 2013-02-28 LAB — RESPIRATORY VIRUS PANEL
Adenovirus: NOT DETECTED
INFLUENZA A H3: DETECTED — AB
INFLUENZA A: DETECTED — AB
Influenza A H1: NOT DETECTED
Influenza B: NOT DETECTED
METAPNEUMOVIRUS: NOT DETECTED
PARAINFLUENZA 1 A: NOT DETECTED
PARAINFLUENZA 2 A: NOT DETECTED
Parainfluenza 3: NOT DETECTED
RESPIRATORY SYNCYTIAL VIRUS B: NOT DETECTED
Respiratory Syncytial Virus A: NOT DETECTED
Rhinovirus: NOT DETECTED

## 2013-02-28 LAB — CBC
HCT: 35.5 % — ABNORMAL LOW (ref 36.0–46.0)
Hemoglobin: 11.8 g/dL — ABNORMAL LOW (ref 12.0–15.0)
MCH: 32.2 pg (ref 26.0–34.0)
MCHC: 33.2 g/dL (ref 30.0–36.0)
MCV: 96.7 fL (ref 78.0–100.0)
PLATELETS: 162 10*3/uL (ref 150–400)
RBC: 3.67 MIL/uL — AB (ref 3.87–5.11)
RDW: 15.4 % (ref 11.5–15.5)
WBC: 5.6 10*3/uL (ref 4.0–10.5)

## 2013-02-28 LAB — INFLUENZA PANEL BY PCR (TYPE A & B)
H1N1FLUPCR: NOT DETECTED
Influenza A By PCR: POSITIVE — AB
Influenza B By PCR: NEGATIVE

## 2013-02-28 LAB — TROPONIN I: Troponin I: <0.3 ng/mL (ref <0.30)

## 2013-02-28 LAB — HEMOGLOBIN A1C
HEMOGLOBIN A1C: 5.7 % — AB (ref <5.7)
Mean Plasma Glucose: 117 mg/dL — ABNORMAL HIGH (ref <117)

## 2013-02-28 LAB — TSH: TSH: 1.518 u[IU]/mL (ref 0.350–4.500)

## 2013-02-28 LAB — MAGNESIUM: Magnesium: 1.5 mg/dL (ref 1.5–2.5)

## 2013-02-28 LAB — PHOSPHORUS: PHOSPHORUS: 3.2 mg/dL (ref 2.3–4.6)

## 2013-02-28 MED ORDER — DOCUSATE SODIUM 100 MG PO CAPS
100.0000 mg | ORAL_CAPSULE | Freq: Two times a day (BID) | ORAL | Status: DC
  Administered 2013-03-01: 100 mg via ORAL
  Filled 2013-02-28 (×6): qty 1

## 2013-02-28 MED ORDER — HYDROCODONE-ACETAMINOPHEN 10-325 MG PO TABS: 1.0000 | ORAL_TABLET | Freq: Four times a day (QID) | ORAL | Status: DC | PRN

## 2013-02-28 MED ORDER — HYDROCODONE-ACETAMINOPHEN 10-325 MG PO TABS
1.0000 | ORAL_TABLET | Freq: Four times a day (QID) | ORAL | Status: DC
  Administered 2013-02-28: 1 via ORAL
  Filled 2013-02-28 (×2): qty 1

## 2013-02-28 MED ORDER — ONDANSETRON HCL 4 MG PO TABS: 4.0000 mg | ORAL_TABLET | Freq: Four times a day (QID) | ORAL | Status: DC | PRN

## 2013-02-28 MED ORDER — SODIUM CHLORIDE 0.9 % IJ SOLN
3.0000 mL | Freq: Two times a day (BID) | INTRAMUSCULAR | Status: DC
  Administered 2013-03-01 (×2): 3 mL via INTRAVENOUS

## 2013-02-28 MED ORDER — ACETAMINOPHEN 325 MG PO TABS: 650.0000 mg | ORAL_TABLET | Freq: Four times a day (QID) | ORAL | Status: DC | PRN

## 2013-02-28 MED ORDER — ENOXAPARIN SODIUM 40 MG/0.4ML ~~LOC~~ SOLN
40.0000 mg | SUBCUTANEOUS | Status: DC
  Administered 2013-02-28 – 2013-03-01 (×2): 40 mg via SUBCUTANEOUS
  Filled 2013-02-28 (×3): qty 0.4

## 2013-02-28 MED ORDER — ACETAMINOPHEN 650 MG RE SUPP: 650.0000 mg | Freq: Four times a day (QID) | RECTAL | Status: DC | PRN

## 2013-02-28 MED ORDER — GUAIFENESIN ER 600 MG PO TB12
600.0000 mg | ORAL_TABLET | Freq: Two times a day (BID) | ORAL | Status: DC
  Administered 2013-02-28 – 2013-03-02 (×5): 600 mg via ORAL
  Filled 2013-02-28 (×6): qty 1

## 2013-02-28 MED ORDER — GUAIFENESIN-DM 100-10 MG/5ML PO SYRP: 5.0000 mL | ORAL_SOLUTION | ORAL | Status: DC | PRN

## 2013-02-28 MED ORDER — SODIUM CHLORIDE 0.9 % IV SOLN: INTRAVENOUS | Status: AC

## 2013-02-28 MED ORDER — LEVOFLOXACIN 750 MG PO TABS
750.0000 mg | ORAL_TABLET | Freq: Every day | ORAL | Status: DC
  Administered 2013-02-28: 750 mg via ORAL
  Filled 2013-02-28: qty 1

## 2013-02-28 MED ORDER — OSELTAMIVIR PHOSPHATE 75 MG PO CAPS
75.0000 mg | ORAL_CAPSULE | Freq: Two times a day (BID) | ORAL | Status: DC
  Administered 2013-02-28 (×2): 75 mg via ORAL
  Filled 2013-02-28 (×3): qty 1

## 2013-02-28 MED ORDER — OSELTAMIVIR PHOSPHATE 75 MG PO CAPS
75.0000 mg | ORAL_CAPSULE | Freq: Every day | ORAL | Status: DC
  Administered 2013-03-01 – 2013-03-02 (×2): 75 mg via ORAL
  Filled 2013-02-28 (×2): qty 1

## 2013-02-28 MED ORDER — PREDNISONE 1 MG PO TABS
4.0000 mg | ORAL_TABLET | Freq: Every day | ORAL | Status: DC
  Administered 2013-02-28 – 2013-03-02 (×3): 4 mg via ORAL
  Filled 2013-02-28 (×3): qty 4

## 2013-02-28 MED ORDER — LEVOFLOXACIN 750 MG PO TABS
750.0000 mg | ORAL_TABLET | ORAL | Status: DC
  Filled 2013-02-28: qty 1

## 2013-02-28 MED ORDER — ONDANSETRON HCL 4 MG/2ML IJ SOLN: 4.0000 mg | Freq: Four times a day (QID) | INTRAMUSCULAR | Status: DC | PRN

## 2013-02-28 MED ORDER — SODIUM CHLORIDE 0.9 % IV SOLN
INTRAVENOUS | Status: AC
  Administered 2013-02-28 (×2): via INTRAVENOUS

## 2013-02-28 NOTE — H&P (Signed)
PCP: [facility]  Chief Complaint:  fall  HPI: Sandra Torres is a 73 y.o. female   has a past medical history of Rheumatoid arthritis.   Presented with  2 day hx of URI symptoms and fatigue with complaints of runny nose, dry cough. Denies joint pains or sore throat. She has had her flu shot this year. Patient is on daily prednisone and weekly methotrexate for hx of RA. Patient was feeling severely fatigued and has fallen down she never lost consiesness but was unable to get up. Reports bumping her head slightly. Denies stiff neck, headache, meningismus. Patient was found 6 hours later by her neighbor. She was brought to the ER and was found to be febrile up to 103. CT of the head and CXR was negative. Patient was noted to be dehydrated. After getting IVF she states she feels better.  denis any chest pain. Hospitalist called for admission.   Review of Systems:    Pertinent positives include: Fevers, chills, fatigue,  non-productive cough,  Constitutional:  No weight loss, night sweats, weight loss  HEENT:  No headaches, Difficulty swallowing,Tooth/dental problems,Sore throat,  No sneezing, itching, ear ache, nasal congestion, post nasal drip,  Cardio-vascular:  No chest pain, Orthopnea, PND, anasarca, dizziness, palpitations.no Bilateral lower extremity swelling  GI:  No heartburn, indigestion, abdominal pain, nausea, vomiting, diarrhea, change in bowel habits, loss of appetite, melena, blood in stool, hematemesis Resp:  no shortness of breath at rest. No dyspnea on exertion, No excess mucus, no productive cough, No No coughing up of blood.No change in color of mucus.No wheezing. Skin:  no rash or lesions. No jaundice GU:  no dysuria, change in color of urine, no urgency or frequency. No straining to urinate.  No flank pain.  Musculoskeletal:  No joint pain or no joint swelling. No decreased range of motion. No back pain.  Psych:  No change in mood or affect.  No depression or anxiety. No memory loss.  Neuro: no localizing neurological complaints, no tingling, no weakness, no double vision, no gait abnormality, no slurred speech, no confusion  Otherwise ROS are negative except for above, 10 systems were reviewed  Past Medical History: Past Medical History  Diagnosis Date  . Rheumatoid arthritis    Past Surgical History  Procedure Laterality Date  . Joint replacement       Medications: Prior to Admission medications   Medication Sig Start Date End Date Taking? Authorizing Provider  Cholecalciferol (VITAMIN D PO) Take by mouth.   Yes Historical Provider, MD  HYDROcodone-acetaminophen (NORCO) 10-325 MG per tablet Take 1 tablet by mouth every 6 (six) hours. As directed.   Yes Historical Provider, MD  Methotrexate Sodium, PF, 50 MG/2ML SOLN Inject 25 mg as directed once a week.   Yes Historical Provider, MD  Multiple Vitamin (MULTIVITAMIN WITH MINERALS) TABS tablet Take 1 tablet by mouth daily.   Yes Historical Provider, MD  omega-3 acid ethyl esters (LOVAZA) 1 G capsule Take 1 g by mouth daily.   Yes Historical Provider, MD  predniSONE (DELTASONE) 1 MG tablet Take 4 mg by mouth daily.   Yes Historical Provider, MD    Allergies:  No Known Allergies  Social History:  Ambulatory  independently   Lives at home alone   reports that she has never smoked. She does not have any smokeless tobacco history on file. She reports that she does not drink alcohol or use illicit drugs.   Family History: family history is negative for  Hypertension, Heart disease, Stroke, and Cancer.    Physical Exam: Patient Vitals for the past 24 hrs:  BP Temp Temp src Pulse Resp SpO2  02/28/13 0012 160/85 mmHg 99.6 F (37.6 C) Oral 94 18 95 %  02/27/13 1950 164/84 mmHg 102.9 F (39.4 C) Oral 98 19 98 %  02/27/13 1946 - - - - - 100 %    1. General:  in No Acute distress 2. Psychological: Alert and   Oriented 3. Head/ENT:     Dry Mucous Membranes                           Head Non traumatic, neck supple                          Normal  Dentition 4. SKIN: decreased Skin turgor,  Skin clean Dry and intact no rash 5. Heart: Regular rate and rhythm no Murmur, Rub or gallop 6. Lungs: Clear to auscultation bilaterally, no wheezes or crackles   7. Abdomen: Soft, non-tender, Non distended 8. Lower extremities: no clubbing, cyanosis, or edema 9. Neurologically Grossly intact, moving all 4 extremities equally 10. MSK: Normal range of motion, deformity of hand joints consistent with rheumatoid arthritis  body mass index is unknown because there is no height or weight on file.   Labs on Admission:   Recent Labs  02/27/13 2040  NA 138  K 4.2  CL 97  CO2 26  GLUCOSE 87  BUN 18  CREATININE 1.17*  CALCIUM 9.3   No results found for this basename: AST, ALT, ALKPHOS, BILITOT, PROT, ALBUMIN,  in the last 72 hours No results found for this basename: LIPASE, AMYLASE,  in the last 72 hours  Recent Labs  02/27/13 2040  WBC 7.5  NEUTROABS 6.3  HGB 12.8  HCT 38.4  MCV 95.3  PLT 184    Recent Labs  02/27/13 2040  CKTOTAL 618*   No results found for this basename: TSH, T4TOTAL, FREET3, T3FREE, THYROIDAB,  in the last 72 hours No results found for this basename: VITAMINB12, FOLATE, FERRITIN, TIBC, IRON, RETICCTPCT,  in the last 72 hours No results found for this basename: HGBA1C    CrCl is unknown because there is no height on file for the current visit. ABG    Component Value Date/Time   TCO2 26 01/29/2009 1023     No results found for this basename: DDIMER    UA no sign of infection   Cultures: No results found for this basename: sdes, specrequest, cult, reptstatus       Radiological Exams on Admission: Dg Chest 2 View  02/28/2013   CLINICAL DATA:  Fever.  Rheumatoid arthritis.  EXAM: CHEST  2 VIEW  COMPARISON:  None.  FINDINGS: Lateral view degraded by patient arm position. Right glenohumeral joint joint space narrowing. Left  shoulder arthroplasty. Midline trachea. Mild cardiomegaly. Right paratracheal soft tissue fullness could be related to prominent great vessels. No pleural effusion or pneumothorax. Left upper lobe nodular density which measures 8 mm and is likely calcified.  IMPRESSION: No acute process or explanation for fever.  Favor scarring in the left upper lobe.  Right paratracheal soft tissue fullness which could be related to prominent great vessels. If there are prior radiographs for comparison, the should be reviewed. If no priors exist, followup with chest radiograph in 3 months is recommended.   Electronically Signed   By: Ronaldo Miyamoto  Reche Dixon M.D.   On: 02/28/2013 00:36   Ct Head Wo Contrast  02/28/2013   CLINICAL DATA:  Fall  EXAM: CT HEAD WITHOUT CONTRAST  TECHNIQUE: Contiguous axial images were obtained from the base of the skull through the vertex without intravenous contrast.  COMPARISON:  None available  FINDINGS: Mild age-related cerebral atrophy with moderate chronic microvascular ischemic changes are present. Prominent atherosclerotic calcifications present within the carotid siphons bilaterally.  There is no acute intracranial hemorrhage or infarct. No mass lesion or midline shift. Gray-white matter differentiation is well maintained. Ventricles are normal in size without evidence of hydrocephalus. CSF containing spaces are within normal limits. No extra-axial fluid collection.  The calvarium is intact.  Orbital soft tissues are within normal limits.  Air-fluid levels noted within the bilateral maxillary sinuses as well as the sphenoid sinuses. Scattered mucoperiosteal thickening present within the ethmoidal air cells. No mastoid effusion.  Scalp soft tissues are unremarkable.  IMPRESSION: 1. No acute intracranial abnormality. 2. Mild cerebral atrophy with moderate chronic microvascular ischemic changes. 3. Air-fluid levels within the maxillary and sphenoid sinuses bilaterally, suggestive of active/acute sinusitis.    Electronically Signed   By: Rise Mu M.D.   On: 02/28/2013 00:16    Chart has been reviewed  Assessment/Plan  This is a 73 year old female with past medical history of rheumatoid arthritis with febrile illness resulting in dehydration and fall  Present on Admission:  . Febrile illness, acute - given the patient immunocompromised will admit him and evaluated father. Obtain blood cultures urine cultures currently pending. Chest x-ray did not show any evidence of pneumonia. Given cough Will, for now with Levaquin. Given high fever and cough Will test for influenza for now cover with Tamiflu. Watch for signs of sepsis at this point patient is hemodynamically stable  . Hypertension - patient is not on any home medications for treatment hypertension we'll hold off on initiation of medication well patient's febrile and been evaluated for early sepsis  . Fall - secondary to generalized fatigue and weakness does not appear to have any localized neurological deficits. Patient is feeling back to baseline  . Dehydration - administer IV fluids and reassess orthostatics  . Abnormal CXR - patient's chest x-ray worrisome for either vascular prominence versus nodularity. Once patient's dehydration has been treated with get a repeat imaging she may benefit from a CT of the chest with contrast at a later time perhaps as an outpatient    Prophylaxis:  Lovenox   CODE STATUS: FULL CODE  Other plan as per orders.  I have spent a total of 55 min on this admission  Sandra Torres 02/28/2013, 1:21 AM

## 2013-02-28 NOTE — Progress Notes (Signed)
Nutrition Brief Note  Patient identified on the Malnutrition Screening Tool (MST) Report  Wt Readings from Last 15 Encounters:  02/28/13 139 lb 3.2 oz (63.141 kg)    Body mass index is 23.88 kg/(m^2). Patient meets criteria for Normal weight based on current BMI.   Pt reported 3 days of 0% PO intake d/t fall and inability to acquire foods. Denied any changes in weight or appetite prior to fall. Appetite was minimal during first meal of admit, but is improving today with breakfast. Denied n/v/dysphagia  Current diet order is regular, patient is consuming approximately 50-75% of meals at this time. Labs and medications reviewed.    No nutrition interventions warranted at this time. If nutrition issues arise, please consult RD.   Lloyd Huger MS RD LDN Clinical Dietitian [phone]

## 2013-02-28 NOTE — ED Notes (Signed)
Hospitalist at bedside 

## 2013-02-28 NOTE — Progress Notes (Signed)
[facility] PROGRESS NOTE  Sandra Torres:[id number] DOB: May 16, 1940 DOA: 02/27/2013 PCP: No primary provider on file.  Assessment/Plan: Dehydration/orthostatic hypotension -continue IVF AKI -pt had serum creatinine 0.8 on 01/29/09 -improving with IVF Influenza like illness -follow up blood culture -UA neg for pyuria -continue levofloxacin pending culture data -follow up influenza PCR -Continue empiric Tamiflu HTN -Patient does not recall the antihypertensive medication she takes -Blood pressure remains stable off of antihypertensives -I have asked the patient to bring in her bottles of medications Rheumatoid arthritis -Patient last received methotrexate on 02/24/2013 -Continue prednisone 4 mg daily Abnormal chest x-ray -Suggests left upper lobe nodularity -May benefit from CT chest once medically stable -?atypical pneumonia vs changes from RA Generalized weakness with mechanical fall -PT eval -CK 618 likely from fall -CT brain shows A/F levels in maxillary and sphenoid sinuses   Family Communication:   Pt at beside Disposition Plan:   Home when medically stable  Antibiotics:  Levofloxacin 02/28/2013    Procedures/Studies: Dg Chest 2 View  02/28/2013   CLINICAL DATA:  Fever.  Rheumatoid arthritis.  EXAM: CHEST  2 VIEW  COMPARISON:  None.  FINDINGS: Lateral view degraded by patient arm position. Right glenohumeral joint joint space narrowing. Left shoulder arthroplasty. Midline trachea. Mild cardiomegaly. Right paratracheal soft tissue fullness could be related to prominent great vessels. No pleural effusion or pneumothorax. Left upper lobe nodular density which measures 8 mm and is likely calcified.  IMPRESSION: No acute process or explanation for fever.  Favor scarring in the left upper lobe.  Right paratracheal soft tissue fullness which could be related to prominent great vessels. If there are prior radiographs for comparison, the should be reviewed. If no  priors exist, followup with chest radiograph in 3 months is recommended.   Electronically Signed   By: Jeronimo Greaves M.D.   On: 02/28/2013 00:36   Ct Head Wo Contrast  02/28/2013   CLINICAL DATA:  Fall  EXAM: CT HEAD WITHOUT CONTRAST  TECHNIQUE: Contiguous axial images were obtained from the base of the skull through the vertex without intravenous contrast.  COMPARISON:  None available  FINDINGS: Mild age-related cerebral atrophy with moderate chronic microvascular ischemic changes are present. Prominent atherosclerotic calcifications present within the carotid siphons bilaterally.  There is no acute intracranial hemorrhage or infarct. No mass lesion or midline shift. Gray-white matter differentiation is well maintained. Ventricles are normal in size without evidence of hydrocephalus. CSF containing spaces are within normal limits. No extra-axial fluid collection.  The calvarium is intact.  Orbital soft tissues are within normal limits.  Air-fluid levels noted within the bilateral maxillary sinuses as well as the sphenoid sinuses. Scattered mucoperiosteal thickening present within the ethmoidal air cells. No mastoid effusion.  Scalp soft tissues are unremarkable.  IMPRESSION: 1. No acute intracranial abnormality. 2. Mild cerebral atrophy with moderate chronic microvascular ischemic changes. 3. Air-fluid levels within the maxillary and sphenoid sinuses bilaterally, suggestive of active/acute sinusitis.   Electronically Signed   By: Rise Mu M.D.   On: 02/28/2013 00:16         Subjective: Patient feeling a little stronger today. Denies any chest pain, shortness breath, nausea, vomiting, diarrhea, abdominal pain, dysuria. She still has a cough without hemoptysis  Objective: Filed Vitals:   02/28/13 0315 02/28/13 0318 02/28/13 0325 02/28/13 0552  BP: 148/83 138/91 129/91 135/82  Pulse: 88 96 109 89  Temp: 99.4 F (37.4 C)   99.1 F (37.3 C)  TempSrc: Oral   Oral  Resp: 20   18  Height:       Weight:      SpO2: 100%   100%    Intake/Output Summary (Last 24 hours) at 02/28/13 1305 Last data filed at 02/28/13 1201  Gross per 24 hour  Intake    360 ml  Output    400 ml  Net    -40 ml   Weight change:  Exam:   General:  Pt is alert, follows commands appropriately, not in acute distress  HEENT: No icterus, No thrush, Zeba/AT  Cardiovascular: RRR, S1/S2, no rubs, no gallops  Respiratory: Bibasilar rales, left greater than right. No wheezing. Good air movement.  Abdomen: Soft/+BS, non tender, non distended, no guarding  Extremities: 1+ LE edema, No lymphangitis, No petechiae, No rashes, no synovitis  Data Reviewed: Basic Metabolic Panel:  Recent Labs Lab 02/27/13 2040 02/28/13 0435  NA 138 137  K 4.2 3.5*  CL 97 101  CO2 26 22  GLUCOSE 87 104*  BUN 18 17  CREATININE 1.17* 1.08  CALCIUM 9.3 8.1*  MG  --  1.5  PHOS  --  3.2   Liver Function Tests:  Recent Labs Lab 02/28/13 0435  AST 49*  ALT 21  ALKPHOS 49  BILITOT 0.4  PROT 6.6  ALBUMIN 3.0*   No results found for this basename: LIPASE, AMYLASE,  in the last 168 hours No results found for this basename: AMMONIA,  in the last 168 hours CBC:  Recent Labs Lab 02/27/13 2040 02/28/13 0435  WBC 7.5 5.6  NEUTROABS 6.3  --   HGB 12.8 11.8*  HCT 38.4 35.5*  MCV 95.3 96.7  PLT 184 162   Cardiac Enzymes:  Recent Labs Lab 02/27/13 2040 02/28/13 0435 02/28/13 1000  CKTOTAL 618*  --   --   TROPONINI  --  <0.30 <0.30   BNP: No components found with this basename: POCBNP,  CBG: No results found for this basename: GLUCAP,  in the last 168 hours  No results found for this or any previous visit (from the past 240 hour(s)).   Scheduled Meds: . sodium chloride   Intravenous STAT  . docusate sodium  100 mg Oral BID  . enoxaparin (LOVENOX) injection  40 mg Subcutaneous Q24H  . guaiFENesin  600 mg Oral BID  . HYDROcodone-acetaminophen  1 tablet Oral Q6H  . [START ON 03/02/2013]  levofloxacin  750 mg Oral Q48H  . [START ON 03/01/2013] oseltamivir  75 mg Oral Daily  . predniSONE  4 mg Oral Daily  . sodium chloride  3 mL Intravenous Q12H   Continuous Infusions:    Merrill Villarruel, DO  [facility] Pager [phone]  If 7PM-7AM, please contact night-coverage [email or web address] Password [email or web address] 02/28/2013, 1:05 PM   LOS: 1 day

## 2013-02-28 NOTE — ED Notes (Signed)
Patient transported to CT 

## 2013-03-01 DIAGNOSIS — I10 Essential (primary) hypertension: Secondary | ICD-10-CM | POA: Diagnosis not present

## 2013-03-01 DIAGNOSIS — J111 Influenza due to unidentified influenza virus with other respiratory manifestations: Secondary | ICD-10-CM | POA: Diagnosis not present

## 2013-03-01 DIAGNOSIS — E860 Dehydration: Secondary | ICD-10-CM | POA: Diagnosis not present

## 2013-03-01 LAB — BASIC METABOLIC PANEL
BUN: 9 mg/dL (ref 6–23)
CALCIUM: 8.3 mg/dL — AB (ref 8.4–10.5)
CO2: 22 meq/L (ref 19–32)
CREATININE: 0.82 mg/dL (ref 0.50–1.10)
Chloride: 102 mEq/L (ref 96–112)
GFR calc Af Amer: 80 mL/min — ABNORMAL LOW (ref >90)
GFR calc non Af Amer: 69 mL/min — ABNORMAL LOW (ref >90)
GLUCOSE: 103 mg/dL — AB (ref 70–99)
Potassium: 3.5 mEq/L — ABNORMAL LOW (ref 3.7–5.3)
Sodium: 137 mEq/L (ref 137–147)

## 2013-03-01 LAB — MAGNESIUM: MAGNESIUM: 1.4 mg/dL — AB (ref 1.5–2.5)

## 2013-03-01 MED ORDER — MAGNESIUM SULFATE 40 MG/ML IJ SOLN
2.0000 g | Freq: Once | INTRAMUSCULAR | Status: AC
  Administered 2013-03-01: 2 g via INTRAVENOUS
  Filled 2013-03-01: qty 50

## 2013-03-01 MED ORDER — BISOPROLOL FUMARATE 5 MG PO TABS
2.5000 mg | ORAL_TABLET | Freq: Every day | ORAL | Status: DC
  Administered 2013-03-01 – 2013-03-02 (×2): 2.5 mg via ORAL
  Filled 2013-03-01 (×2): qty 0.5

## 2013-03-01 MED ORDER — LEVOFLOXACIN 750 MG PO TABS
750.0000 mg | ORAL_TABLET | Freq: Every day | ORAL | Status: DC
  Administered 2013-03-01 – 2013-03-02 (×2): 750 mg via ORAL
  Filled 2013-03-01 (×2): qty 1

## 2013-03-01 NOTE — Progress Notes (Signed)
[facility] PROGRESS NOTE  Sandra Torres BHA:[id number] DOB: 01/01/1941 DOA: 02/27/2013 PCP: No primary provider on file.  Assessment/Plan: Dehydration/orthostatic hypotension  -improved with IVF  AKI  -pt had serum creatinine 0.8 on 01/29/09  -improving with IVF  Influenza -follow up blood culture--neg to date  -UA neg for pyuria  -continue levofloxacin for sinusitis -influenza PCR--positive  -Continue empiric Tamiflu  HTN  -restart bisoprolol   Rheumatoid arthritis  -Patient last received methotrexate on 02/24/2013  -Continue prednisone 4 mg daily  Abnormal chest x-ray  -Suggests left upper lobe nodularity  -May benefit from CT chest once medically stable  -?atypical pneumonia vs changes from RA  Generalized weakness with mechanical fall  -PT eval  -CK 618 likely from fall  -CT brain shows A/F levels in maxillary and sphenoid sinuses  Family Communication: Pt at beside  Disposition Plan: Home 03/02/13 if stable Antibiotics:  Levofloxacin 02/28/2013 Tamiflu 02/28/13>>>        Procedures/Studies: Dg Chest 2 View  02/28/2013   CLINICAL DATA:  Fever.  Rheumatoid arthritis.  EXAM: CHEST  2 VIEW  COMPARISON:  None.  FINDINGS: Lateral view degraded by patient arm position. Right glenohumeral joint joint space narrowing. Left shoulder arthroplasty. Midline trachea. Mild cardiomegaly. Right paratracheal soft tissue fullness could be related to prominent great vessels. No pleural effusion or pneumothorax. Left upper lobe nodular density which measures 8 mm and is likely calcified.  IMPRESSION: No acute process or explanation for fever.  Favor scarring in the left upper lobe.  Right paratracheal soft tissue fullness which could be related to prominent great vessels. If there are prior radiographs for comparison, the should be reviewed. If no priors exist, followup with chest radiograph in 3 months is recommended.   Electronically Signed   By: Abigail Miyamoto M.D.   On: 02/28/2013  00:36   Ct Head Wo Contrast  02/28/2013   CLINICAL DATA:  Fall  EXAM: CT HEAD WITHOUT CONTRAST  TECHNIQUE: Contiguous axial images were obtained from the base of the skull through the vertex without intravenous contrast.  COMPARISON:  None available  FINDINGS: Mild age-related cerebral atrophy with moderate chronic microvascular ischemic changes are present. Prominent atherosclerotic calcifications present within the carotid siphons bilaterally.  There is no acute intracranial hemorrhage or infarct. No mass lesion or midline shift. Gray-white matter differentiation is well maintained. Ventricles are normal in size without evidence of hydrocephalus. CSF containing spaces are within normal limits. No extra-axial fluid collection.  The calvarium is intact.  Orbital soft tissues are within normal limits.  Air-fluid levels noted within the bilateral maxillary sinuses as well as the sphenoid sinuses. Scattered mucoperiosteal thickening present within the ethmoidal air cells. No mastoid effusion.  Scalp soft tissues are unremarkable.  IMPRESSION: 1. No acute intracranial abnormality. 2. Mild cerebral atrophy with moderate chronic microvascular ischemic changes. 3. Air-fluid levels within the maxillary and sphenoid sinuses bilaterally, suggestive of active/acute sinusitis.   Electronically Signed   By: Jeannine Boga M.D.   On: 02/28/2013 00:16         Subjective: Patient is feeling better overall but continues to feel weak. Denies any chest pain, shortness of breath, nausea, vomiting, diarrhea, abdominal pain.  Has a productive cough with yellow sputum. No hemoptysis.  Objective: Filed Vitals:   02/28/13 0552 02/28/13 1426 02/28/13 2057 03/01/13 0500  BP: 135/82 143/86 152/83 154/99  Pulse: 89 88 83 91  Temp: 99.1 F (37.3 C) 98.4 F (36.9 C) 99.7 F (37.6 C) 100.1 F (37.8  C)  TempSrc: Oral Oral Oral Oral  Resp: _0 Height:      Weight:      SpO2: 100% 100% 100% 100%     Intake/Output Summary (Last 24 hours) at 03/01/13 1330 Last data filed at 03/01/13 0757  Gross per 24 hour  Intake    240 ml  Output   2625 ml  Net  -2385 ml   Weight change:  Exam:   General:  Pt is alert, follows commands appropriately, not in acute distress  HEENT: No icterus, No thrush,, [place]/AT  Cardiovascular: RRR, S1/S2, no rubs, no gallops  Respiratory: Bilateral rales, left greater than right. No wheezing. Good air movement.  Abdomen: Soft/+BS, non tender, non distended, no guarding  Extremities: 1+ LE edema, No lymphangitis, No petechiae, No rashes, no synovitis  Data Reviewed: Basic Metabolic Panel:  Recent Labs Lab 02/27/13 2040 02/28/13 0435 03/01/13 0746  NA 138 137 137  K 4.2 3.5* 3.5*  CL 97 101 102  CO2 _1 GLUCOSE 87 104* 103*  BUN _2 CREATININE 1.17* 1.08 0.82  CALCIUM 9.3 8.1* 8.3*  MG  --  1.5 1.4*  PHOS  --  3.2  --    Liver Function Tests:  Recent Labs Lab 02/28/13 0435  AST 49*  ALT 21  ALKPHOS 49  BILITOT 0.4  PROT 6.6  ALBUMIN 3.0*   No results found for this basename: LIPASE, AMYLASE,  in the last 168 hours No results found for this basename: AMMONIA,  in the last 168 hours CBC:  Recent Labs Lab 02/27/13 2040 02/28/13 0435  WBC 7.5 5.6  NEUTROABS 6.3  --   HGB 12.8 11.8*  HCT 38.4 35.5*  MCV 95.3 96.7  PLT 184 162   Cardiac Enzymes:  Recent Labs Lab 02/27/13 2040 02/28/13 0435 02/28/13 1000 02/28/13 1648  CKTOTAL 618*  --   --   --   TROPONINI  --  <0.30 <0.30 <0.30   BNP: No components found with this basename: POCBNP,  CBG: No results found for this basename: GLUCAP,  in the last 168 hours  Recent Results (from the past 240 hour(s))  RESPIRATORY VIRUS PANEL     Status: Abnormal   Collection Time    02/28/13  2:59 AM      Result Value Range Status   Source - RVPAN NOSE   Final   Respiratory Syncytial Virus A NOT DETECTED   Final   Respiratory Syncytial Virus B NOT DETECTED   Final    Influenza A DETECTED (*)  Final   Influenza B NOT DETECTED   Final   Parainfluenza 1 NOT DETECTED   Final   Parainfluenza 2 NOT DETECTED   Final   Parainfluenza 3 NOT DETECTED   Final   Metapneumovirus NOT DETECTED   Final   Rhinovirus NOT DETECTED   Final   Adenovirus NOT DETECTED   Final   Influenza A H1 NOT DETECTED   Final   Influenza A H3 DETECTED (*)  Final   Comment: (NOTE)           Normal Reference Range for each Analyte: NOT DETECTED     Testing performed using the Luminex xTAG Respiratory Viral Panel test     kit.     This test was developed and its performance characteristics determined     by [facility]. It has not been cleared or approved by the [place]  Food and Drug Administration. This test is used for clinical purposes.     It should not be regarded as investigational or for research. This     laboratory is certified under the [place] (CLIA) as qualified to perform high complexity     clinical laboratory testing.     Performed at [place], BLOOD (ROUTINE X 2)     Status: None   Collection Time    02/28/13  4:30 AM      Result Value Range Status   Specimen Description BLOOD RIGHT ARM   Final   Special Requests BOTTLES DRAWN AEROBIC AND ANAEROBIC 4CC   Final   Culture  Setup Time     Final   Value: 02/28/2013 08:55     Performed at [facility]   Culture     Final   Value:        BLOOD CULTURE RECEIVED NO GROWTH TO DATE CULTURE WILL BE HELD FOR 5 DAYS BEFORE ISSUING A FINAL NEGATIVE REPORT     Performed at [facility]   Report Status PENDING   Incomplete  CULTURE, BLOOD (ROUTINE X 2)     Status: None   Collection Time    02/28/13  4:55 AM      Result Value Range Status   Specimen Description BLOOD RIGHT HAND   Final   Special Requests BOTTLES DRAWN AEROBIC ONLY 3 CC   Final   Culture  Setup Time     Final   Value: 02/28/2013 08:55     Performed at [facility]   Culture     Final   Value:        BLOOD CULTURE RECEIVED NO GROWTH TO DATE CULTURE WILL BE HELD FOR 5 DAYS BEFORE ISSUING A FINAL NEGATIVE REPORT     Performed at [facility]   Report Status PENDING   Incomplete     Scheduled Meds: . docusate sodium  100 mg Oral BID  . enoxaparin (LOVENOX) injection  40 mg Subcutaneous Q24H  . guaiFENesin  600 mg Oral BID  . [START ON 03/02/2013] levofloxacin  750 mg Oral Q48H  . oseltamivir  75 mg Oral Daily  . predniSONE  4 mg Oral Daily  . sodium chloride  3 mL Intravenous Q12H   Continuous Infusions:    , , DO  [facility] Pager [phone]  If 7PM-7AM, please contact night-coverage [email or web address] Password [email or web address] 03/01/2013, 1:30 PM   LOS: 2 days

## 2013-03-02 DIAGNOSIS — I10 Essential (primary) hypertension: Secondary | ICD-10-CM | POA: Diagnosis not present

## 2013-03-02 DIAGNOSIS — E860 Dehydration: Secondary | ICD-10-CM | POA: Diagnosis not present

## 2013-03-02 DIAGNOSIS — J111 Influenza due to unidentified influenza virus with other respiratory manifestations: Secondary | ICD-10-CM | POA: Diagnosis not present

## 2013-03-02 LAB — BASIC METABOLIC PANEL
BUN: 12 mg/dL (ref 6–23)
CHLORIDE: 104 meq/L (ref 96–112)
CO2: 26 meq/L (ref 19–32)
CREATININE: 0.96 mg/dL (ref 0.50–1.10)
Calcium: 8.5 mg/dL (ref 8.4–10.5)
GFR calc Af Amer: 66 mL/min — ABNORMAL LOW (ref >90)
GFR calc non Af Amer: 57 mL/min — ABNORMAL LOW (ref >90)
GLUCOSE: 89 mg/dL (ref 70–99)
Potassium: 3.8 mEq/L (ref 3.7–5.3)
Sodium: 139 mEq/L (ref 137–147)

## 2013-03-02 LAB — CK: Total CK: 329 U/L — ABNORMAL HIGH (ref 7–177)

## 2013-03-02 LAB — MAGNESIUM: MAGNESIUM: 2 mg/dL (ref 1.5–2.5)

## 2013-03-02 MED ORDER — OSELTAMIVIR PHOSPHATE 75 MG PO CAPS: 75.0000 mg | ORAL_CAPSULE | Freq: Every day | ORAL | Status: DC

## 2013-03-02 MED ORDER — LEVOFLOXACIN 750 MG PO TABS: 750.0000 mg | ORAL_TABLET | Freq: Every day | ORAL | Status: DC

## 2013-03-02 NOTE — Care Management Note (Signed)
Cm spoke with patient at the bedside concerning discharge planning. MD order for HHPT/RN. No pt eval noted. Pt provided with choice list for [facility]. Pt declines services at this time. Pt informed PCP can order HH if patient decides to utilize services in the future.    Roxy Manns Journiee Feldkamp,MSN,RN [phone]

## 2013-03-02 NOTE — Discharge Summary (Signed)
Physician Discharge Summary  Sandra Torres ZOX:[id number] DOB: 13-May-1940 DOA: 02/27/2013  PCP: No primary provider on file.  Admit date: 02/27/2013 Discharge date: 03/02/2013  Recommendations for Outpatient Follow-up:  1. Pt will need to follow up with PCP in 2 weeks post discharge 2. Please obtain BMP to evaluate electrolytes and kidney function 3. Please also check CBC to evaluate Hg and Hct levels   Discharge Diagnoses:  Active Problems:   Febrile illness, acute   Hypertension   Fall   Dehydration   Acute febrile illness   Abnormal CXR   Fever of unknown origin   Hypomagnesemia   Influenza with other respiratory manifestations Dehydration/orthostatic hypotension  -improved with IVF  AKI  -pt had serum creatinine 0.8 on 01/29/09  -improving with IVF  Influenza  -follow up blood culture--neg to date  -UA neg for pyuria  -continue levofloxacin for sinusitis--she will have 5 additional days which will complete 7 days of therapy -CT of the brain showed air-fluid levels in the maxillary and sphenoid -influenza PCR--positive  -ContinueTamiflu  days which will complete 5 days of therapy HTN  -restart bisoprolol  Rheumatoid arthritis  -Patient last received methotrexate on 02/24/2013  -Continue prednisone 4 mg daily  Abnormal chest x-ray  -Suggests left upper lobe nodularity  -May benefit from CT chest once medically stable--she will need CT of the chest an outpatient setting, preferably with IV contrast once the patient is clinically stable   -?atypical pneumonia vs changes from RA  Generalized weakness with mechanical fall  -PT eval  -CK 618 likely from fall--improved to 329  -CT brain shows A/F levels in maxillary and sphenoid sinuses  Family Communication: Pt at beside  Disposition Plan: Home 03/02/13 if stable  Antibiotics:  Levofloxacin 02/28/2013  Tamiflu 02/28/13>>>   Discharge Condition: stable  Disposition: home  Diet:regular Wt Readings from Last 3  Encounters:  02/28/13 63.141 kg (139 lb 3.2 oz)    History of present illness:   73 year old female with a history of rheumatoid arthritis and hypertension presented with 2 days of URI symptoms at with fatigue. She also had  complaints of runny nose, dry cough. Denies joint pains or sore throat. She has had her flu shot this year. Patient is on daily prednisone and weekly methotrexate for hx of RA. Patient was feeling severely fatigued and has fallen down she never lost consiesness but was unable to get up. Reports bumping her head slightly. Denies stiff neck, headache, meningismus. Patient was found 6 hours later by her neighbor. She was brought to the ER and was found to be febrile up to 103. CT of the head and CXR was negative. Patient was noted to be dehydrated. After getting IVF she states she feels better. denis any chest pain. Orthostatics were positive. The patient was started on intravenous fluids. Her serum creatinine improved from 1.17 to 0.96. Influenza PCR was positive. The patient was started on Tamiflu. She was started on Levaquin empirically which also would treat her sinusitis which was noted on her CT of the brain. Her magnesium was corrected. Electrolytes were optimized. The patient will need CT of the chest to clarify the incidental finding of a left upper lobe nodular density.    DischAndarge Exam: Filed Vitals:   03/02/13 0626  BP: 144/83  Pulse: 67  Temp: 98.7 F (37.1 C)  Resp: 18   Filed Vitals:   03/01/13 0500 03/01/13 1447 03/01/13 2100 03/02/13 0626  BP: 154/99 136/72 132/85 144/83  Pulse: 91 80 65  67  Temp: 100.1 F (37.8 C) 98.3 F (36.8 C) 98.8 F (37.1 C) 98.7 F (37.1 C)  TempSrc: Oral Oral Oral Oral  Resp: 18 18 18 18   Height:      Weight:      SpO2: 100% 100% 98% 98%   General: A&O x 3, NAD, pleasant, cooperative Cardiovascular: RRR, no rub, no gallop, no S3 Respiratory: Bibasilar rales. No wheezing. Good air movement.  Abdomen:soft, nontender,  nondistended, positive bowel sounds Extremities: 1+ edema, No lymphangitis, no petechiae  Discharge Instructions     Medication List         bisoprolol 5 MG tablet  Commonly known as:  ZEBETA  Take 5 mg by mouth daily.     HYDROcodone-acetaminophen 10-325 MG per tablet  Commonly known as:  NORCO  Take 1 tablet by mouth every 6 (six) hours. As directed.     levofloxacin 750 MG tablet  Commonly known as:  LEVAQUIN  Take 1 tablet (750 mg total) by mouth daily.     Methotrexate Sodium (PF) 50 MG/2ML Soln  Inject 25 mg as directed once a week.     multivitamin with minerals Tabs tablet  Take 1 tablet by mouth daily.     omega-3 acid ethyl esters 1 G capsule  Commonly known as:  LOVAZA  Take 1 g by mouth daily.     oseltamivir 75 MG capsule  Commonly known as:  TAMIFLU  Take 1 capsule (75 mg total) by mouth daily.     predniSONE 1 MG tablet  Commonly known as:  DELTASONE  Take 4 mg by mouth daily.     VITAMIN D PO  Take by mouth.         The results of significant diagnostics from this hospitalization (including imaging, microbiology, ancillary and laboratory) are listed below for reference.    Significant Diagnostic Studies: Dg Chest 2 View  02/28/2013   CLINICAL DATA:  Fever.  Rheumatoid arthritis.  EXAM: CHEST  2 VIEW  COMPARISON:  None.  FINDINGS: Lateral view degraded by patient arm position. Right glenohumeral joint joint space narrowing. Left shoulder arthroplasty. Midline trachea. Mild cardiomegaly. Right paratracheal soft tissue fullness could be related to prominent great vessels. No pleural effusion or pneumothorax. Left upper lobe nodular density which measures 8 mm and is likely calcified.  IMPRESSION: No acute process or explanation for fever.  Favor scarring in the left upper lobe.  Right paratracheal soft tissue fullness which could be related to prominent great vessels. If there are prior radiographs for comparison, the should be reviewed. If no priors  exist, followup with chest radiograph in 3 months is recommended.   Electronically Signed   By: Abigail Miyamoto M.D.   On: 02/28/2013 00:36   Ct Head Wo Contrast  02/28/2013   CLINICAL DATA:  Fall  EXAM: CT HEAD WITHOUT CONTRAST  TECHNIQUE: Contiguous axial images were obtained from the base of the skull through the vertex without intravenous contrast.  COMPARISON:  None available  FINDINGS: Mild age-related cerebral atrophy with moderate chronic microvascular ischemic changes are present. Prominent atherosclerotic calcifications present within the carotid siphons bilaterally.  There is no acute intracranial hemorrhage or infarct. No mass lesion or midline shift. Gray-white matter differentiation is well maintained. Ventricles are normal in size without evidence of hydrocephalus. CSF containing spaces are within normal limits. No extra-axial fluid collection.  The calvarium is intact.  Orbital soft tissues are within normal limits.  Air-fluid levels noted within the bilateral maxillary  sinuses as well as the sphenoid sinuses. Scattered mucoperiosteal thickening present within the ethmoidal air cells. No mastoid effusion.  Scalp soft tissues are unremarkable.  IMPRESSION: 1. No acute intracranial abnormality. 2. Mild cerebral atrophy with moderate chronic microvascular ischemic changes. 3. Air-fluid levels within the maxillary and sphenoid sinuses bilaterally, suggestive of active/acute sinusitis.   Electronically Signed   By: Jeannine Boga M.D.   On: 02/28/2013 00:16     Microbiology: Recent Results (from the past 240 hour(s))  RESPIRATORY VIRUS PANEL     Status: Abnormal   Collection Time    02/28/13  2:59 AM      Result Value Range Status   Source - RVPAN NOSE   Final   Respiratory Syncytial Virus A NOT DETECTED   Final   Respiratory Syncytial Virus B NOT DETECTED   Final   Influenza A DETECTED (*)  Final   Influenza B NOT DETECTED   Final   Parainfluenza 1 NOT DETECTED   Final    Parainfluenza 2 NOT DETECTED   Final   Parainfluenza 3 NOT DETECTED   Final   Metapneumovirus NOT DETECTED   Final   Rhinovirus NOT DETECTED   Final   Adenovirus NOT DETECTED   Final   Influenza A H1 NOT DETECTED   Final   Influenza A H3 DETECTED (*)  Final   Comment: (NOTE)           Normal Reference Range for each Analyte: NOT DETECTED     Testing performed using the Luminex xTAG Respiratory Viral Panel test     kit.     This test was developed and its performance characteristics determined     by [facility]. It has not been cleared or approved by the [place]     Food and Drug Administration. This test is used for clinical purposes.     It should not be regarded as investigational or for research. This     laboratory is certified under the [place] (CLIA) as qualified to perform high complexity     clinical laboratory testing.     Performed at [place], BLOOD (ROUTINE X 2)     Status: None   Collection Time    02/28/13  4:30 AM      Result Value Range Status   Specimen Description BLOOD RIGHT ARM   Final   Special Requests BOTTLES DRAWN AEROBIC AND ANAEROBIC 4CC   Final   Culture  Setup Time     Final   Value: 02/28/2013 08:55     Performed at [facility]   Culture     Final   Value:        BLOOD CULTURE RECEIVED NO GROWTH TO DATE CULTURE WILL BE HELD FOR 5 DAYS BEFORE ISSUING A FINAL NEGATIVE REPORT     Performed at [facility]   Report Status PENDING   Incomplete  CULTURE, BLOOD (ROUTINE X 2)     Status: None   Collection Time    02/28/13  4:55 AM      Result Value Range Status   Specimen Description BLOOD RIGHT HAND   Final   Special Requests BOTTLES DRAWN AEROBIC ONLY 3 CC   Final   Culture  Setup Time     Final   Value: 02/28/2013 08:55     Performed at [facility]   Culture     Final  Value:        BLOOD CULTURE RECEIVED NO GROWTH TO DATE CULTURE WILL BE HELD FOR 5  DAYS BEFORE ISSUING A FINAL NEGATIVE REPORT     Performed at [facility]   Report Status PENDING   Incomplete     Labs: Basic Metabolic Panel:  Recent Labs Lab 02/27/13 2040 02/28/13 0435 03/01/13 0746 03/02/13 0515  NA 138 137 137 139  K 4.2 3.5* 3.5* 3.8  CL 97 101 102 104  CO2 26 22 22 26   GLUCOSE 87 104* 103* 89  BUN 18 17 9 12   CREATININE 1.17* 1.08 0.82 0.96  CALCIUM 9.3 8.1* 8.3* 8.5  MG  --  1.5 1.4* 2.0  PHOS  --  3.2  --   --    Liver Function Tests:  Recent Labs Lab 02/28/13 0435  AST 49*  ALT 21  ALKPHOS 49  BILITOT 0.4  PROT 6.6  ALBUMIN 3.0*   No results found for this basename: LIPASE, AMYLASE,  in the last 168 hours No results found for this basename: AMMONIA,  in the last 168 hours CBC:  Recent Labs Lab 02/27/13 2040 02/28/13 0435  WBC 7.5 5.6  NEUTROABS 6.3  --   HGB 12.8 11.8*  HCT 38.4 35.5*  MCV 95.3 96.7  PLT 184 162   Cardiac Enzymes:  Recent Labs Lab 02/27/13 2040 02/28/13 0435 02/28/13 1000 02/28/13 1648 03/02/13 0515  CKTOTAL 618*  --   --   --  329*  TROPONINI  --  <0.30 <0.30 <0.30  --    BNP: No components found with this basename: POCBNP,  CBG: No results found for this basename: GLUCAP,  in the last 168 hours  Time coordinating discharge:  Greater than 30 minutes  Signed:  Daine Croker, DO [facility] Pager: [phone] 03/02/2013, 7:59 AM

## 2013-03-02 NOTE — Evaluation (Signed)
Physical Therapy One Time Evaluation Patient Details Name: Sandra Torres MRN: [id number] DOB: [birth year] Today's Date: 03/02/2013 Time: 5852-7782 PT Time Calculation (min): 14 min  PT Assessment / Plan / Recommendation History of Present Illness  Pt is a 73 year old female admitted with 2 day hx of URI symptoms and fatigue with complaints of runny nose, dry cough. Denies joint pains or sore throat. She has had her flu shot this year. Patient is on daily prednisone and weekly methotrexate for hx of RA. Patient was feeling severely fatigued and has fallen down she never lost consiesness but was unable to get up. Reports bumping her head slightly. Denies stiff neck, headache, meningismus. Patient was found 6 hours later by her neighbor. She was brought to the ER and was found to be febrile up to 103. CT of the head and CXR was negative. Patient was noted to be dehydrated. After getting IVF she states she feels better.  denis any chest pain. Hospitalist called for admission.    Clinical Impression  Patient evaluated by Physical Therapy with no further acute PT needs identified. All education has been completed and the patient has no further questions.  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  No obvious balance deficits with testing and mobility however discussed using RW or SPC at home if LEs feeling weak or she feels unsteady.  No follow-up Physial Therapy or equipment needs. PT is signing off. Thank you for this referral.     PT Assessment  Patent does not need any further PT services    Follow Up Recommendations  No PT follow up    Does the patient have the potential to tolerate intense rehabilitation      Barriers to Discharge        Equipment Recommendations  None recommended by PT    Recommendations for Other Services     Frequency      Precautions / Restrictions  Precautions Precautions: Fall   Pertinent Vitals/Pain No c/o pain      Mobility  Bed Mobility Overal bed mobility: Modified Independent Transfers Overall transfer level: Modified independent Ambulation/Gait Ambulation/Gait assistance: Supervision Ambulation Distance (Feet): 160 Feet Assistive device: None Gait Pattern/deviations: Step-through pattern Gait velocity: decr General Gait Details: able to perform head turns without difficulty    Exercises     PT Diagnosis:    PT Problem List:   PT Treatment Interventions:       PT Goals(Current goals can be found in the care plan section) Acute Rehab PT Goals PT Goal Formulation: No goals set, d/c therapy  Visit Information  Last PT Received On: 03/02/13 Assistance Needed: +1 History of Present Illness: Pt is a 73 year old female admitted with 2 day hx of URI symptoms and fatigue with complaints of runny nose, dry cough. Denies joint pains or sore throat. She has had her flu shot this year. Patient is on daily prednisone and weekly methotrexate for hx of RA. Patient was feeling severely fatigued and has fallen down she never lost consiesness but was unable to get up. Reports bumping her head slightly. Denies stiff neck, headache, meningismus. Patient was found 6 hours later by her neighbor. She was brought to the ER and was found to be febrile up to 103. CT of the head and CXR was negative. Patient was noted to be dehydrated. After  getting IVF she states she feels better.  denis any chest pain. Hospitalist called for admission.         Prior Functioning  Home Living Family/patient expects to be discharged to:: Private residence Living Arrangements: Alone Home Access: Level entry Home Layout: One level Home Equipment: Environmental consultant - 2 wheels;Cane - single point Prior Function Level of Independence: Independent Communication Communication: No difficulties    Cognition  Cognition Arousal/Alertness: Awake/alert Behavior During  Therapy: WFL for tasks assessed/performed Overall Cognitive Status: Within Functional Limits for tasks assessed    Extremity/Trunk Assessment Lower Extremity Assessment Lower Extremity Assessment: LLE deficits/detail LLE Deficits / Details: L hip slightly weaker than R upon MMT however 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

## 2013-03-06 LAB — CULTURE, BLOOD (ROUTINE X 2)
CULTURE: NO GROWTH
Culture: NO GROWTH

## 2013-04-05 DIAGNOSIS — E876 Hypokalemia: Secondary | ICD-10-CM | POA: Diagnosis not present

## 2013-04-05 DIAGNOSIS — J111 Influenza due to unidentified influenza virus with other respiratory manifestations: Secondary | ICD-10-CM | POA: Diagnosis not present

## 2013-07-04 DIAGNOSIS — E039 Hypothyroidism, unspecified: Secondary | ICD-10-CM | POA: Diagnosis not present

## 2013-07-04 DIAGNOSIS — I10 Essential (primary) hypertension: Secondary | ICD-10-CM | POA: Diagnosis not present

## 2013-07-26 DIAGNOSIS — M069 Rheumatoid arthritis, unspecified: Secondary | ICD-10-CM | POA: Diagnosis not present

## 2013-10-11 DIAGNOSIS — M069 Rheumatoid arthritis, unspecified: Secondary | ICD-10-CM | POA: Diagnosis not present

## 2013-10-25 DIAGNOSIS — Z23 Encounter for immunization: Secondary | ICD-10-CM | POA: Diagnosis not present

## 2013-12-25 DIAGNOSIS — E039 Hypothyroidism, unspecified: Secondary | ICD-10-CM | POA: Diagnosis not present

## 2013-12-25 DIAGNOSIS — I10 Essential (primary) hypertension: Secondary | ICD-10-CM | POA: Diagnosis not present

## 2013-12-25 DIAGNOSIS — M859 Disorder of bone density and structure, unspecified: Secondary | ICD-10-CM | POA: Diagnosis not present

## 2013-12-31 DIAGNOSIS — M810 Age-related osteoporosis without current pathological fracture: Secondary | ICD-10-CM | POA: Diagnosis not present

## 2013-12-31 DIAGNOSIS — I10 Essential (primary) hypertension: Secondary | ICD-10-CM | POA: Diagnosis not present

## 2013-12-31 DIAGNOSIS — M059 Rheumatoid arthritis with rheumatoid factor, unspecified: Secondary | ICD-10-CM | POA: Diagnosis not present

## 2014-01-23 DIAGNOSIS — Z1231 Encounter for screening mammogram for malignant neoplasm of breast: Secondary | ICD-10-CM | POA: Diagnosis not present

## 2014-02-13 DIAGNOSIS — M0579 Rheumatoid arthritis with rheumatoid factor of multiple sites without organ or systems involvement: Secondary | ICD-10-CM | POA: Diagnosis not present

## 2014-03-17 DIAGNOSIS — L723 Sebaceous cyst: Secondary | ICD-10-CM | POA: Diagnosis not present

## 2014-03-27 DIAGNOSIS — L723 Sebaceous cyst: Secondary | ICD-10-CM | POA: Diagnosis not present

## 2014-05-01 DIAGNOSIS — I10 Essential (primary) hypertension: Secondary | ICD-10-CM | POA: Diagnosis not present

## 2014-05-06 DIAGNOSIS — I10 Essential (primary) hypertension: Secondary | ICD-10-CM | POA: Diagnosis not present

## 2014-05-06 DIAGNOSIS — K219 Gastro-esophageal reflux disease without esophagitis: Secondary | ICD-10-CM | POA: Diagnosis not present

## 2014-05-06 DIAGNOSIS — M069 Rheumatoid arthritis, unspecified: Secondary | ICD-10-CM | POA: Diagnosis not present

## 2014-05-06 DIAGNOSIS — E039 Hypothyroidism, unspecified: Secondary | ICD-10-CM | POA: Diagnosis not present

## 2014-05-12 DIAGNOSIS — M549 Dorsalgia, unspecified: Secondary | ICD-10-CM | POA: Diagnosis not present

## 2014-05-29 DIAGNOSIS — I10 Essential (primary) hypertension: Secondary | ICD-10-CM | POA: Diagnosis not present

## 2014-06-03 DIAGNOSIS — I10 Essential (primary) hypertension: Secondary | ICD-10-CM | POA: Diagnosis not present

## 2014-06-03 DIAGNOSIS — E039 Hypothyroidism, unspecified: Secondary | ICD-10-CM | POA: Diagnosis not present

## 2014-06-03 DIAGNOSIS — M0589 Other rheumatoid arthritis with rheumatoid factor of multiple sites: Secondary | ICD-10-CM | POA: Diagnosis not present

## 2014-06-12 DIAGNOSIS — M0579 Rheumatoid arthritis with rheumatoid factor of multiple sites without organ or systems involvement: Secondary | ICD-10-CM | POA: Diagnosis not present

## 2014-10-17 DIAGNOSIS — Z23 Encounter for immunization: Secondary | ICD-10-CM | POA: Diagnosis not present

## 2014-10-22 IMAGING — CR DG CHEST 2V
2 series · 2 of 2 positions shown · non-contrast
Comparison: None.

CLINICAL DATA: Fever.  Rheumatoid arthritis.

EXAM:
CHEST  2 VIEW

[w chest lat]
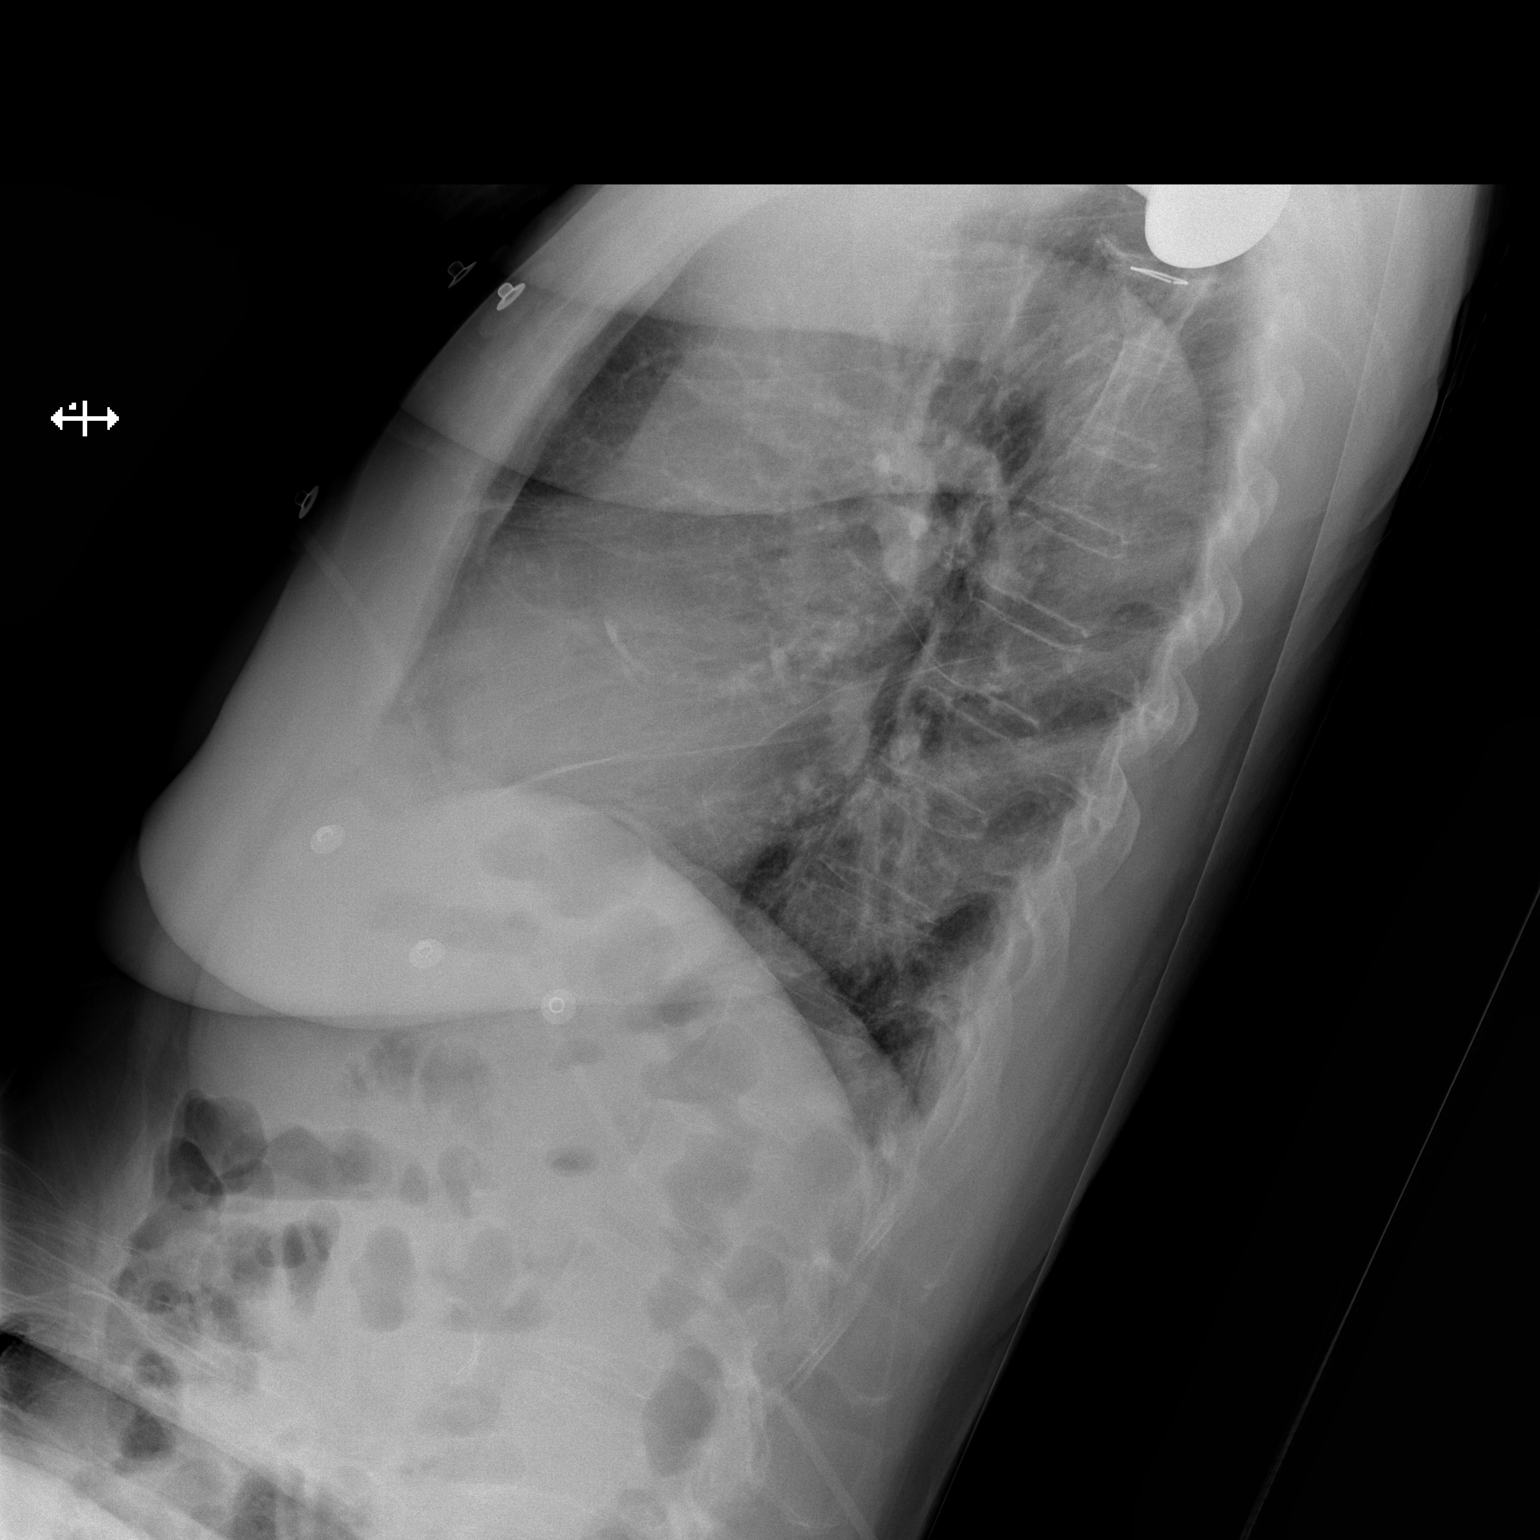

[x chest ap]
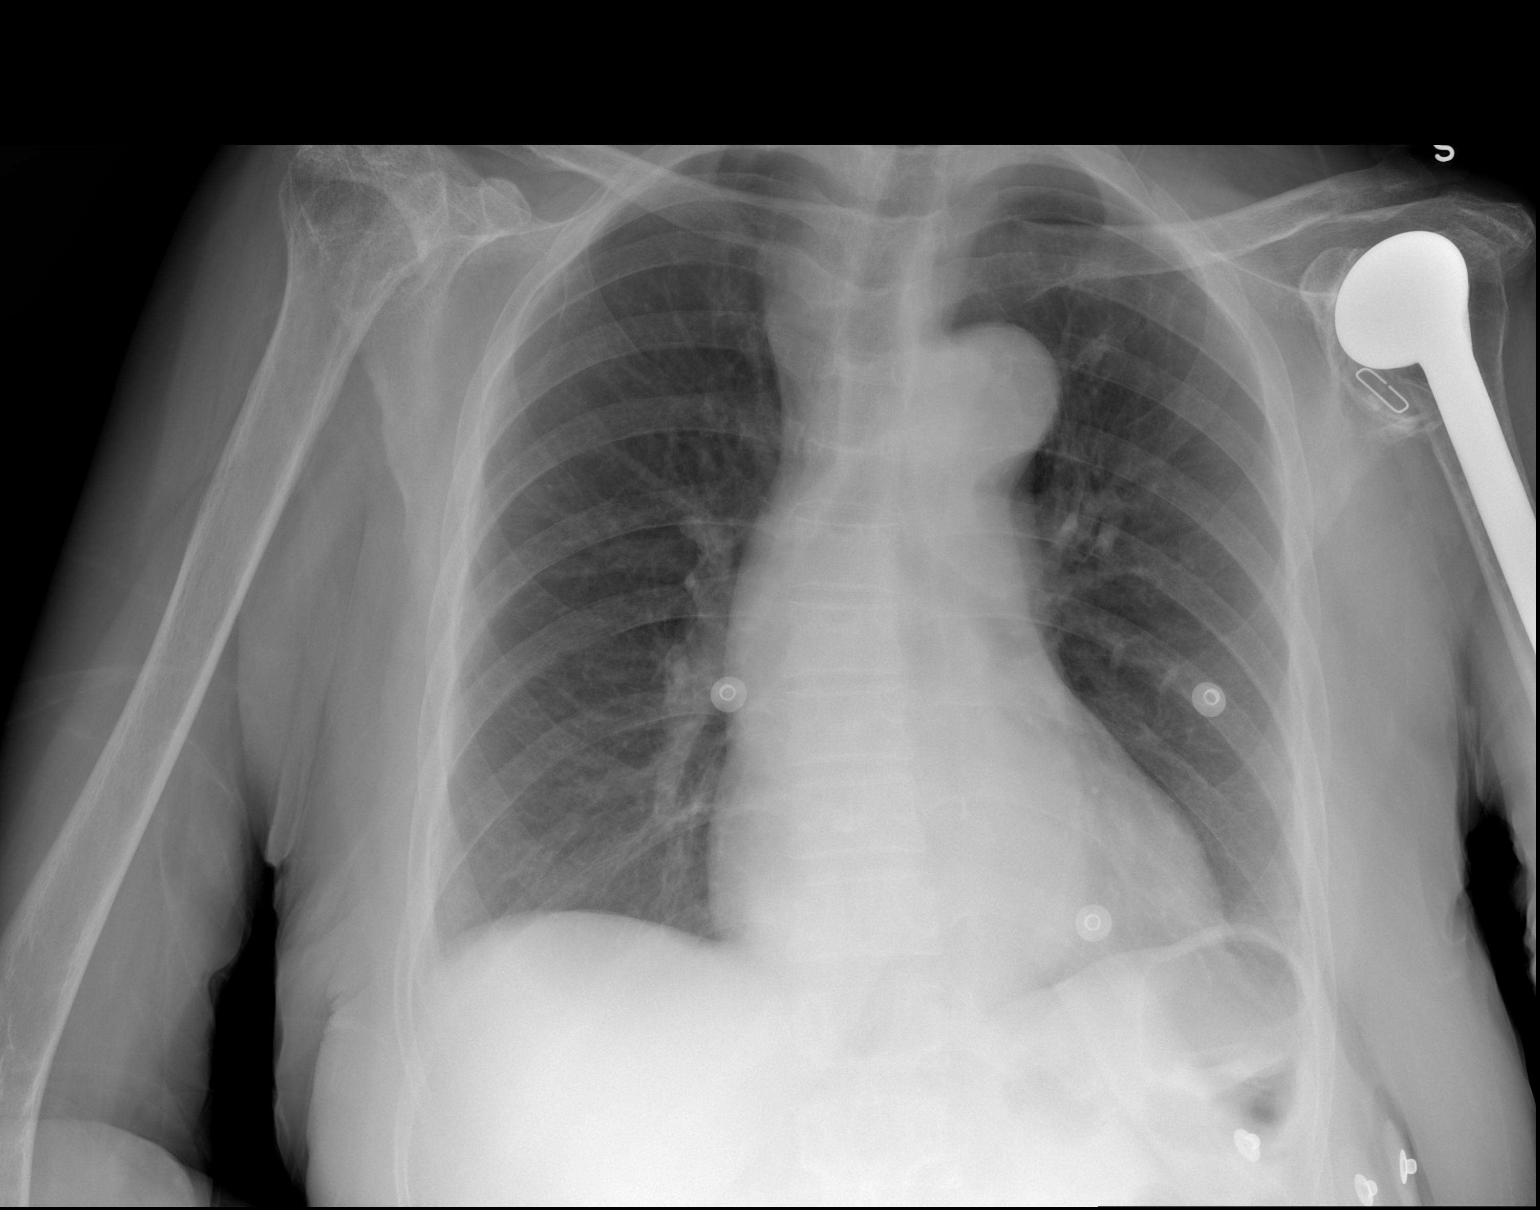

[2 of 2 positions shown; findings below may reference images not displayed]

FINDINGS: Lateral view degraded by patient arm position. Right glenohumeral
joint joint space narrowing. Left shoulder arthroplasty. Midline
trachea. Mild cardiomegaly. Right paratracheal soft tissue fullness
could be related to prominent great vessels. No pleural effusion or
pneumothorax. Left upper lobe nodular density which measures 8 mm
and is likely calcified.
IMPRESSION: No acute process or explanation for fever.

Favor scarring in the left upper lobe.

Right paratracheal soft tissue fullness which could be related to
prominent great vessels. If there are prior radiographs for
comparison, the should be reviewed. If no priors exist, followup
with chest radiograph in 3 months is recommended.

## 2014-10-23 IMAGING — CT CT HEAD W/O CM
2 series · 16 of 30 positions shown, 19 images · non-contrast
Comparison: None available

CLINICAL DATA: Fall

EXAM:
CT HEAD WITHOUT CONTRAST
TECHNIQUE: Contiguous axial images were obtained from the base of the skull
through the vertex without intravenous contrast.

[Series 2: head w/o · axial · non-contrast · 0.48mm/px · z∈[+1500,+1600]mm · 9 of 26 slices shown, 12 images]
[im 3/26  brain]
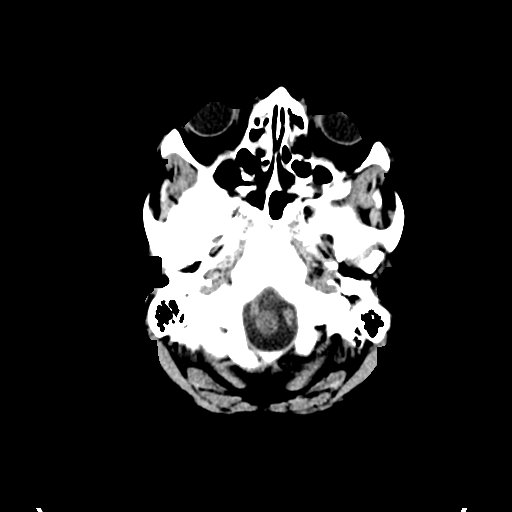
[im 3/26  bone]
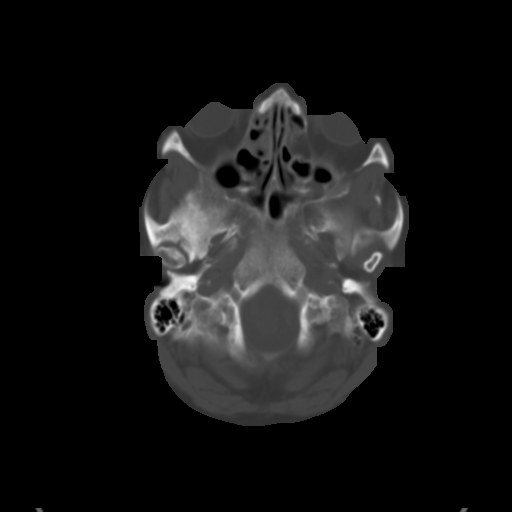
[im 6/26  brain]
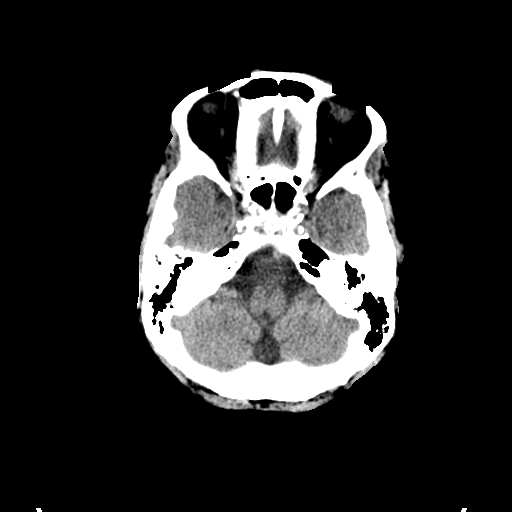
[im 8/26  brain]
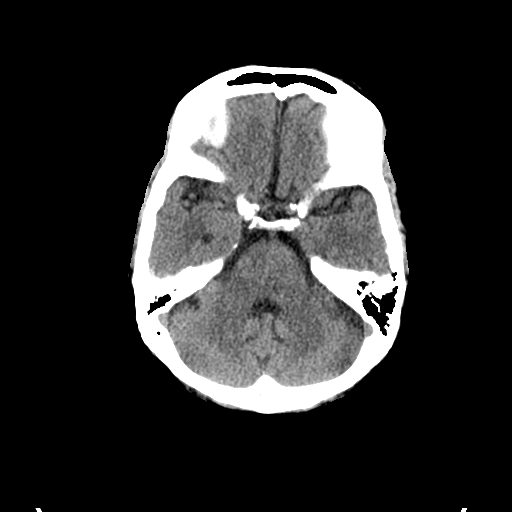
[im 11/26  brain]
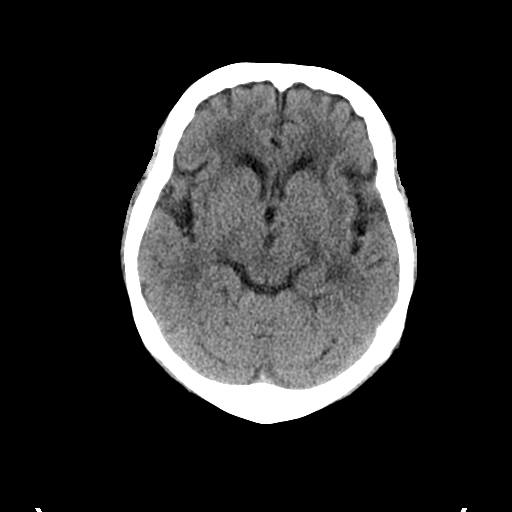
[im 13/26  brain]
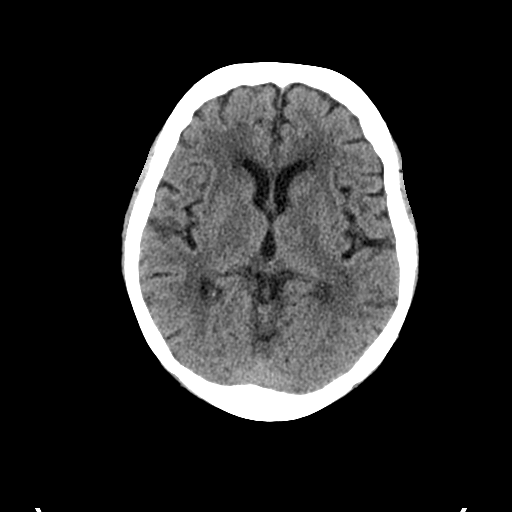
[im 13/26  bone]
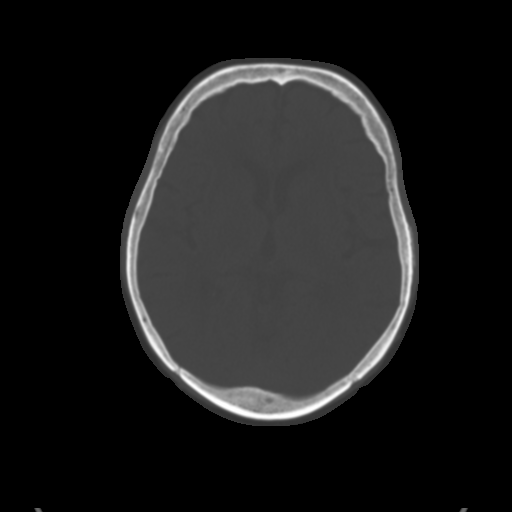
[im 16/26  brain]
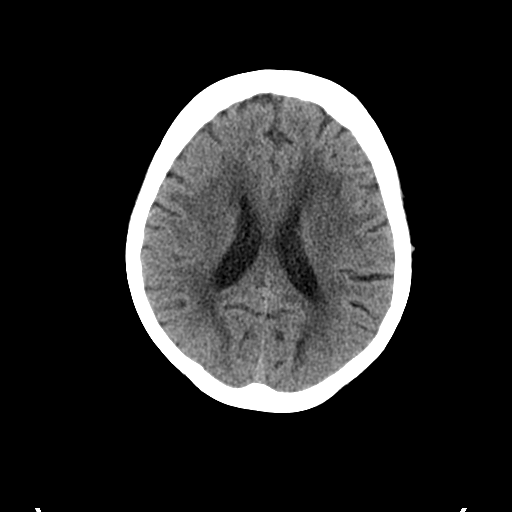
[im 18/26  brain]
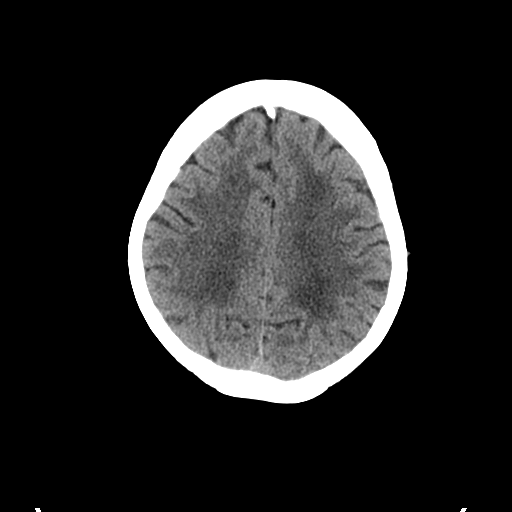
[im 21/26  brain]
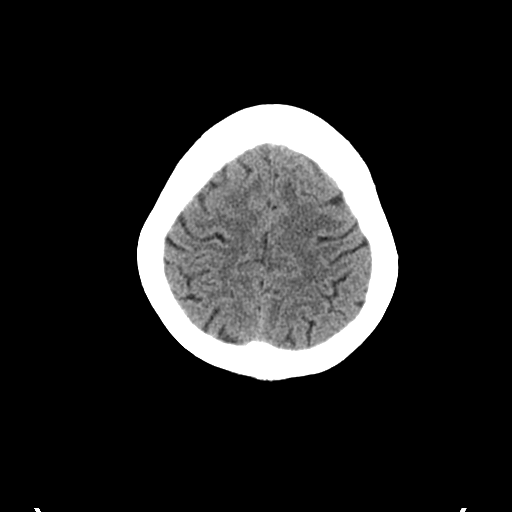
[im 23/26  brain]
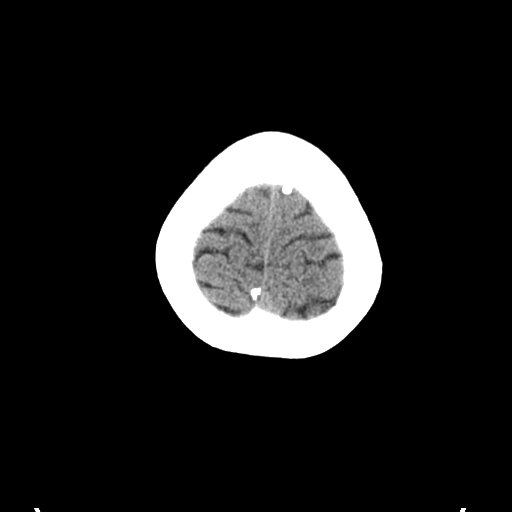
[im 23/26  bone]
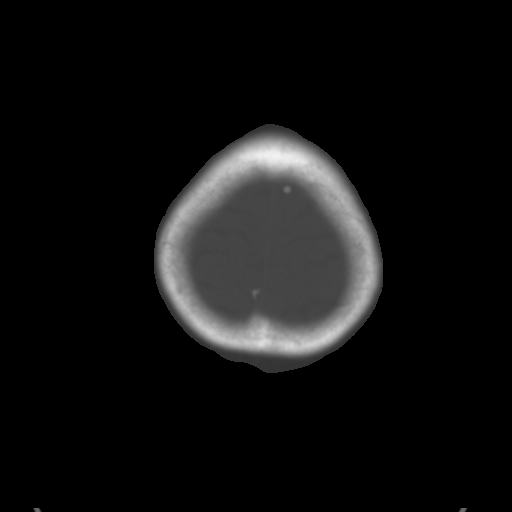

[Series 3: bone windows · axial · 0.48mm/px · z∈[+1502,+1583]mm · 7 of 42 slices shown]
[im 5/42  bone]
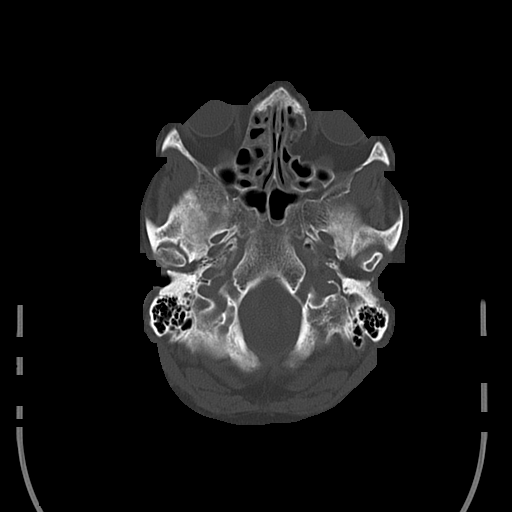
[im 10/42  bone]
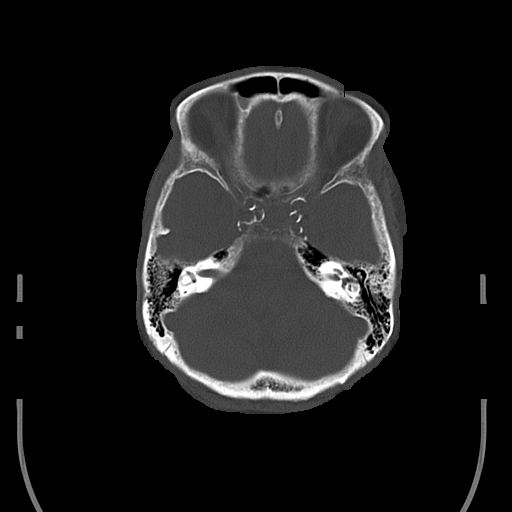
[im 14/42  bone]
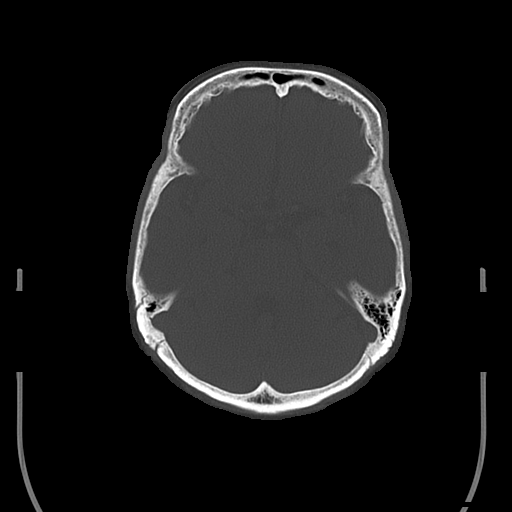
[im 19/42  bone]
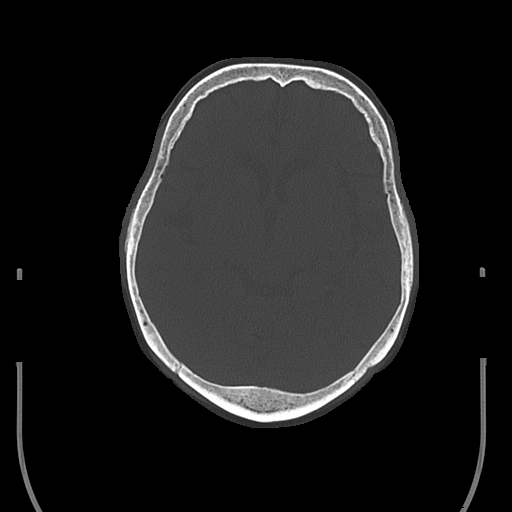
[im 23/42  bone]
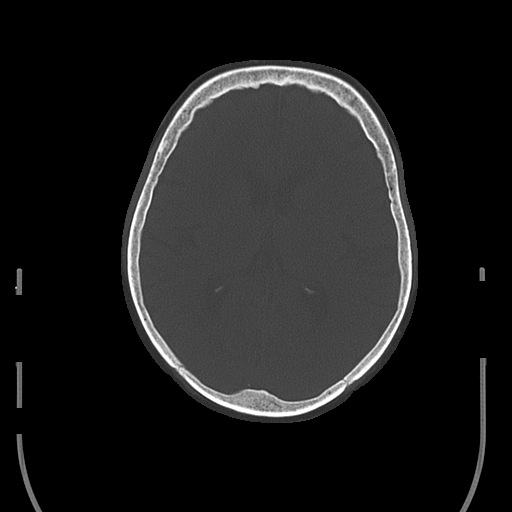
[im 28/42  bone]
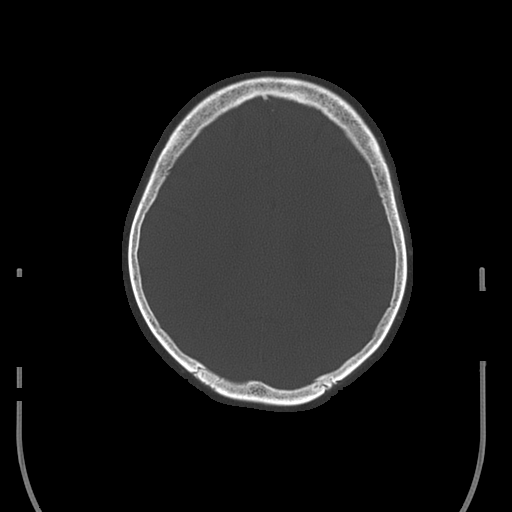
[im 32/42  bone]
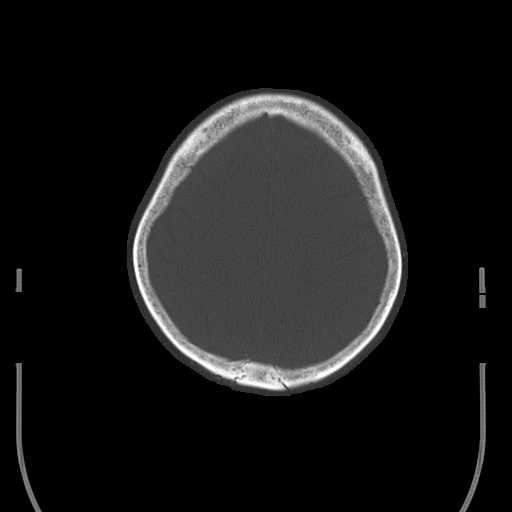

[16 of 30 positions shown; findings below may reference images not displayed]

FINDINGS: Mild age-related cerebral atrophy with moderate chronic
microvascular ischemic changes are present. Prominent
atherosclerotic calcifications present within the carotid siphons
bilaterally.

There is no acute intracranial hemorrhage or infarct. No mass lesion
or midline shift. Gray-white matter differentiation is well
maintained. Ventricles are normal in size without evidence of
hydrocephalus. CSF containing spaces are within normal limits. No
extra-axial fluid collection.

The calvarium is intact.

Orbital soft tissues are within normal limits.

Air-fluid levels noted within the bilateral maxillary sinuses as
well as the sphenoid sinuses. Scattered mucoperiosteal thickening
present within the ethmoidal air cells. No mastoid effusion.

Scalp soft tissues are unremarkable.
IMPRESSION: 1. No acute intracranial abnormality.
2. Mild cerebral atrophy with moderate chronic microvascular
ischemic changes.
3. Air-fluid levels within the maxillary and sphenoid sinuses
bilaterally, suggestive of active/acute sinusitis.

## 2014-10-27 DIAGNOSIS — Z79899 Other long term (current) drug therapy: Secondary | ICD-10-CM | POA: Diagnosis not present

## 2014-10-27 DIAGNOSIS — M0579 Rheumatoid arthritis with rheumatoid factor of multiple sites without organ or systems involvement: Secondary | ICD-10-CM | POA: Diagnosis not present

## 2014-11-28 DIAGNOSIS — I10 Essential (primary) hypertension: Secondary | ICD-10-CM | POA: Diagnosis not present

## 2014-11-28 DIAGNOSIS — E039 Hypothyroidism, unspecified: Secondary | ICD-10-CM | POA: Diagnosis not present

## 2014-12-02 DIAGNOSIS — I10 Essential (primary) hypertension: Secondary | ICD-10-CM | POA: Diagnosis not present

## 2014-12-02 DIAGNOSIS — M059 Rheumatoid arthritis with rheumatoid factor, unspecified: Secondary | ICD-10-CM | POA: Diagnosis not present

## 2014-12-02 DIAGNOSIS — E039 Hypothyroidism, unspecified: Secondary | ICD-10-CM | POA: Diagnosis not present

## 2014-12-02 DIAGNOSIS — M859 Disorder of bone density and structure, unspecified: Secondary | ICD-10-CM | POA: Diagnosis not present

## 2015-01-05 DIAGNOSIS — I10 Essential (primary) hypertension: Secondary | ICD-10-CM | POA: Diagnosis not present

## 2015-01-05 DIAGNOSIS — R011 Cardiac murmur, unspecified: Secondary | ICD-10-CM | POA: Diagnosis not present

## 2015-01-05 DIAGNOSIS — R5383 Other fatigue: Secondary | ICD-10-CM | POA: Diagnosis not present

## 2015-01-05 DIAGNOSIS — Z6827 Body mass index (BMI) 27.0-27.9, adult: Secondary | ICD-10-CM | POA: Diagnosis not present

## 2015-01-13 DIAGNOSIS — R011 Cardiac murmur, unspecified: Secondary | ICD-10-CM | POA: Diagnosis not present

## 2015-01-27 DIAGNOSIS — Z1231 Encounter for screening mammogram for malignant neoplasm of breast: Secondary | ICD-10-CM | POA: Diagnosis not present

## 2015-01-28 DIAGNOSIS — Z79899 Other long term (current) drug therapy: Secondary | ICD-10-CM | POA: Diagnosis not present

## 2015-01-28 DIAGNOSIS — M0579 Rheumatoid arthritis with rheumatoid factor of multiple sites without organ or systems involvement: Secondary | ICD-10-CM | POA: Diagnosis not present

## 2015-01-28 DIAGNOSIS — M130 Polyarthritis, unspecified: Secondary | ICD-10-CM | POA: Diagnosis not present

## 2015-03-26 DIAGNOSIS — J209 Acute bronchitis, unspecified: Secondary | ICD-10-CM | POA: Diagnosis not present

## 2015-03-26 DIAGNOSIS — J309 Allergic rhinitis, unspecified: Secondary | ICD-10-CM | POA: Diagnosis not present

## 2015-03-31 DIAGNOSIS — J40 Bronchitis, not specified as acute or chronic: Secondary | ICD-10-CM | POA: Diagnosis not present

## 2015-04-29 DIAGNOSIS — M0579 Rheumatoid arthritis with rheumatoid factor of multiple sites without organ or systems involvement: Secondary | ICD-10-CM | POA: Diagnosis not present

## 2015-04-29 DIAGNOSIS — M130 Polyarthritis, unspecified: Secondary | ICD-10-CM | POA: Diagnosis not present

## 2015-04-29 DIAGNOSIS — Z79899 Other long term (current) drug therapy: Secondary | ICD-10-CM | POA: Diagnosis not present

## 2015-05-28 DIAGNOSIS — E039 Hypothyroidism, unspecified: Secondary | ICD-10-CM | POA: Diagnosis not present

## 2015-05-28 DIAGNOSIS — E559 Vitamin D deficiency, unspecified: Secondary | ICD-10-CM | POA: Diagnosis not present

## 2015-05-28 DIAGNOSIS — M859 Disorder of bone density and structure, unspecified: Secondary | ICD-10-CM | POA: Diagnosis not present

## 2015-05-28 DIAGNOSIS — I10 Essential (primary) hypertension: Secondary | ICD-10-CM | POA: Diagnosis not present

## 2015-06-02 DIAGNOSIS — Z1389 Encounter for screening for other disorder: Secondary | ICD-10-CM | POA: Diagnosis not present

## 2015-06-02 DIAGNOSIS — I10 Essential (primary) hypertension: Secondary | ICD-10-CM | POA: Diagnosis not present

## 2015-06-02 DIAGNOSIS — M0579 Rheumatoid arthritis with rheumatoid factor of multiple sites without organ or systems involvement: Secondary | ICD-10-CM | POA: Diagnosis not present

## 2015-06-02 DIAGNOSIS — I340 Nonrheumatic mitral (valve) insufficiency: Secondary | ICD-10-CM | POA: Diagnosis not present

## 2015-06-02 DIAGNOSIS — M8580 Other specified disorders of bone density and structure, unspecified site: Secondary | ICD-10-CM | POA: Diagnosis not present

## 2015-06-29 DIAGNOSIS — R197 Diarrhea, unspecified: Secondary | ICD-10-CM | POA: Diagnosis not present

## 2015-06-29 DIAGNOSIS — D8989 Other specified disorders involving the immune mechanism, not elsewhere classified: Secondary | ICD-10-CM | POA: Diagnosis not present

## 2015-06-30 DIAGNOSIS — R197 Diarrhea, unspecified: Secondary | ICD-10-CM | POA: Diagnosis not present

## 2015-06-30 DIAGNOSIS — R109 Unspecified abdominal pain: Secondary | ICD-10-CM | POA: Diagnosis not present

## 2015-07-01 DIAGNOSIS — R109 Unspecified abdominal pain: Secondary | ICD-10-CM | POA: Diagnosis not present

## 2015-07-01 DIAGNOSIS — K8020 Calculus of gallbladder without cholecystitis without obstruction: Secondary | ICD-10-CM | POA: Diagnosis not present

## 2015-07-01 DIAGNOSIS — K828 Other specified diseases of gallbladder: Secondary | ICD-10-CM | POA: Diagnosis not present

## 2015-07-01 DIAGNOSIS — R197 Diarrhea, unspecified: Secondary | ICD-10-CM | POA: Diagnosis not present

## 2015-07-01 DIAGNOSIS — N281 Cyst of kidney, acquired: Secondary | ICD-10-CM | POA: Diagnosis not present

## 2015-07-02 ENCOUNTER — Encounter (HOSPITAL_COMMUNITY): Payer: Self-pay

## 2015-07-02 ENCOUNTER — Emergency Department (HOSPITAL_COMMUNITY)
Admission: EM | Admit: 2015-07-02 | Discharge: 2015-07-02 | Disposition: A | Discharge status: Home / Self Care | Payer: Medicare Other | Attending: Emergency Medicine | Admitting: Emergency Medicine

## 2015-07-02 DIAGNOSIS — Z96653 Presence of artificial knee joint, bilateral: Secondary | ICD-10-CM | POA: Insufficient documentation

## 2015-07-02 DIAGNOSIS — Z96642 Presence of left artificial hip joint: Secondary | ICD-10-CM | POA: Insufficient documentation

## 2015-07-02 DIAGNOSIS — M069 Rheumatoid arthritis, unspecified: Secondary | ICD-10-CM | POA: Diagnosis not present

## 2015-07-02 DIAGNOSIS — R197 Diarrhea, unspecified: Secondary | ICD-10-CM | POA: Diagnosis not present

## 2015-07-02 DIAGNOSIS — Z79899 Other long term (current) drug therapy: Secondary | ICD-10-CM | POA: Diagnosis not present

## 2015-07-02 DIAGNOSIS — Z96612 Presence of left artificial shoulder joint: Secondary | ICD-10-CM | POA: Insufficient documentation

## 2015-07-02 HISTORY — DX: Essential (primary) hypertension: I10

## 2015-07-02 LAB — CBC WITH DIFFERENTIAL/PLATELET
Basophils Absolute: 0 10*3/uL (ref 0.0–0.1)
Basophils Relative: 0 %
EOS PCT: 2 %
Eosinophils Absolute: 0.1 10*3/uL (ref 0.0–0.7)
HCT: 32.2 % — ABNORMAL LOW (ref 36.0–46.0)
Hemoglobin: 10.9 g/dL — ABNORMAL LOW (ref 12.0–15.0)
Lymphocytes Relative: 22 %
Lymphs Abs: 1.4 10*3/uL (ref 0.7–4.0)
MCH: 31.7 pg (ref 26.0–34.0)
MCHC: 33.9 g/dL (ref 30.0–36.0)
MCV: 93.6 fL (ref 78.0–100.0)
MONO ABS: 0.3 10*3/uL (ref 0.1–1.0)
Monocytes Relative: 4 %
NEUTROS PCT: 72 %
Neutro Abs: 4.5 10*3/uL (ref 1.7–7.7)
Platelets: 246 10*3/uL (ref 150–400)
RBC: 3.44 MIL/uL — ABNORMAL LOW (ref 3.87–5.11)
RDW: 14.9 % (ref 11.5–15.5)
WBC: 6.3 10*3/uL (ref 4.0–10.5)

## 2015-07-02 LAB — COMPREHENSIVE METABOLIC PANEL
ALT: 12 U/L — ABNORMAL LOW (ref 14–54)
AST: 20 U/L (ref 15–41)
Albumin: 3.5 g/dL (ref 3.5–5.0)
Alkaline Phosphatase: 48 U/L (ref 38–126)
Anion gap: 8 (ref 5–15)
BUN: 10 mg/dL (ref 6–20)
CHLORIDE: 109 mmol/L (ref 101–111)
CO2: 24 mmol/L (ref 22–32)
Calcium: 9.6 mg/dL (ref 8.9–10.3)
Creatinine, Ser: 1.04 mg/dL — ABNORMAL HIGH (ref 0.44–1.00)
GFR calc Af Amer: 59 mL/min — ABNORMAL LOW (ref >60)
GFR, EST NON AFRICAN AMERICAN: 51 mL/min — AB (ref >60)
Glucose, Bld: 90 mg/dL (ref 65–99)
Potassium: 3.7 mmol/L (ref 3.5–5.1)
SODIUM: 141 mmol/L (ref 135–145)
Total Bilirubin: 0.7 mg/dL (ref 0.3–1.2)
Total Protein: 6.6 g/dL (ref 6.5–8.1)

## 2015-07-02 LAB — LIPASE, BLOOD: Lipase: 30 U/L (ref 11–51)

## 2015-07-02 NOTE — ED Notes (Signed)
Pt c/o intermittent RLQ abdominal pain and diarrhea x 5 days and emesis x 1 episode x 3 days ago.  Denies pain.  Pt reports oral intake makes pain worse.  Pt reports recent ultrasound, but has not been called w/ results.

## 2015-07-02 NOTE — ED Notes (Signed)
Patient informed that we needed to get blood from her for testing. She rolled her eyes and said that she 'will not have an IV' and is 'a difficult stick'. She said that she 'will require phlebotomy as nurses can't get it.' [employer].

## 2015-07-02 NOTE — ED Provider Notes (Signed)
CSN: [id number]     Arrival date & time 07/02/15  1341 History   First MD Initiated Contact with Patient 07/02/15 1345     No chief complaint on file.    (Consider location/radiation/quality/duration/timing/severity/associated sxs/prior Treatment) HPI   75 year old female with history of rheumatoid arthritis presenting with complaints of abdominal pain. Patient report for the past 6 days she has had persistent diarrhea with watery stool ranging from 5-10 bouts a day and that has not improved. Yesterday she also developed right lower quadrant abdominal pain. She described pain as a crampy sharp sensation improved with having bowel movement. Pain is waxing waning and currently not present. There is no associated fever, chills, lightheadedness, dizziness, chest pain, shortness of breath, dysuria, hematuria, hematochezia or melena. Patient reports taking one dose of antibiotic 2 weeks ago for some dental procedure. She also did follow-up at urgent care 4 days ago for her complaint. She was prescribed Imodium. She also received blood work test, and a chest x-ray and was told that she would need to get an ultrasound to ensure she does not have a AAA. She had an abdominal ultrasound done yesterday but did not receive any results. She is here today voicing concern about her recent x-ray result and not knowing the results of ultrasound. She does not have any other complaint. She has a left ovary removed several years ago. She mentioned that Imodium has not helped her symptom.     Past Medical History  Diagnosis Date  . Rheumatoid arthritis    Past Surgical History  Procedure Laterality Date  . Joint replacement     Family History  Problem Relation Age of Onset  . Hypertension Neg Hx   . Heart disease Neg Hx   . Stroke Neg Hx   . Cancer Neg Hx    Social History  Substance Use Topics  . Smoking status: Never Smoker   . Smokeless tobacco: Not on file  . Alcohol Use: No   OB History    No  data available     Review of Systems  All other systems reviewed and are negative.     Allergies  Review of patient's allergies indicates no known allergies.  Home Medications   Prior to Admission medications   Medication Sig Start Date End Date Taking? Authorizing Provider  bisoprolol (ZEBETA) 5 MG tablet Take 5 mg by mouth daily.    Historical Provider, MD  Cholecalciferol (VITAMIN D PO) Take by mouth.    Historical Provider, MD  HYDROcodone-acetaminophen (NORCO) 10-325 MG per tablet Take 1 tablet by mouth every 6 (six) hours. As directed.    Historical Provider, MD  levofloxacin (LEVAQUIN) 750 MG tablet Take 1 tablet (750 mg total) by mouth daily. 03/02/13   Catarina Hartshorn, MD  Methotrexate Sodium, PF, 50 MG/2ML SOLN Inject 25 mg as directed once a week.    Historical Provider, MD  Multiple Vitamin (MULTIVITAMIN WITH MINERALS) TABS tablet Take 1 tablet by mouth daily.    Historical Provider, MD  omega-3 acid ethyl esters (LOVAZA) 1 G capsule Take 1 g by mouth daily.    Historical Provider, MD  oseltamivir (TAMIFLU) 75 MG capsule Take 1 capsule (75 mg total) by mouth daily. 03/02/13   Catarina Hartshorn, MD  predniSONE (DELTASONE) 1 MG tablet Take 4 mg by mouth daily.    Historical Provider, MD   There were no vitals taken for this visit. Physical Exam  Constitutional: She is oriented to person, place, and time. She appears  well-developed and well-nourished. No distress.  HENT:  Head: Atraumatic.  Eyes: Conjunctivae are normal.  Neck: Neck supple.  Cardiovascular: Normal rate and regular rhythm.   Pulmonary/Chest: Effort normal and breath sounds normal.  Abdominal: Soft. Bowel sounds are normal. She exhibits no distension. There is no tenderness.  Abdomen is soft and nontender. Negative Murphy sign, no pain at McBurney's point. Bowel sounds are present.  Neurological: She is alert and oriented to person, place, and time.  Skin: No rash noted.  Psychiatric: She has a normal mood and affect.   Nursing note and vitals reviewed.   ED Course  Procedures (including critical care time) Labs Review Labs Reviewed  CBC WITH DIFFERENTIAL/PLATELET - Abnormal; Notable for the following:    RBC 3.44 (*)    Hemoglobin 10.9 (*)    HCT 32.2 (*)    All other components within normal limits  COMPREHENSIVE METABOLIC PANEL - Abnormal; Notable for the following:    Creatinine, Ser 1.04 (*)    ALT 12 (*)    GFR calc non Af Amer 51 (*)    GFR calc Af Amer 59 (*)    All other components within normal limits  LIPASE, BLOOD    Imaging Review No results found. I have personally reviewed and evaluated these images and lab results as part of my medical decision-making.  CLINICAL DATA:  Five days of abdominal pain and diarrhea ; report of abnormality on an outside x-ray that may indicate an abdominal aortic aneurysm.  EXAM: ABDOMEN ULTRASOUND COMPLETE  COMPARISON:  None in PACs  FINDINGS: Gallbladder: The gallbladder is adequately distended. There are multiple echogenic mobile shadowing stones the largest of which measures 4 mm in diameter. There is no gallbladder wall thickening, pericholecystic fluid, or positive sonographic Murphy's sign.  Common bile duct: Diameter: 6 mm. No intraluminal stones are observed.  Liver: No focal lesion identified. Within normal limits in parenchymal echogenicity.  IVC: No abnormality visualized.  Pancreas: Visualized portion unremarkable.  Spleen: Size and appearance within normal limits.  Right Kidney: Length: 10 cm. The renal cortical echotexture is approximately equal to that of the liver. There is cortical thinning. There is a midpole simple appearing cyst measuring 2.7 cm in greatest dimension. There is an upper pole cyst measuring 1.1 cm in diameter. There is no hydronephrosis.  Left Kidney: Length: 9.7 cm. The renal cortical echotexture is similar to that on the right. There is no hydronephrosis nor cystic or solid  mass.  Abdominal aorta: The abdominal aorta exhibits a tapering caliber. There is mural plaque and calcification. No aneurysm is observed. The maximal diameter is 2.6 cm which occurs proximally.  Other findings: No ascites is observed.  IMPRESSION: 1. Gallstones without sonographic evidence of acute cholecystitis. The liver, pancreas, and common bile duct are unremarkable. 2. Increased renal cortical echotexture bilaterally may reflect medical renal disease. There are simple appearing cysts in the right kidney. There is no hydronephrosis. 3. No abdominal aortic aneurysm is observed.  There is mural plaque.   Electronically Signed   By: David  [place] M.D.   On: 07/01/2015 12:29    EKG Interpretation None      MDM   Final diagnoses:  Diarrhea, unspecified type   BP 161/98 mmHg  Pulse 69  Temp(Src) 97.8 F (36.6 C) (Oral)  Resp 16  SpO2 100%   2:28 PM Elderly female presents with persistent diarrhea. She did take 1 dose of antibiotic 2 weeks ago. Have low suspicion for C. difficile. Her primary  concern is to find out the results of her recent abdominal ultrasound performed yesterday. We were able to review her recent abdominal ultrasound which show evidence of gallstone but no evidence of cholecystitis. No other acute pathology noted. She has a normal aorta with some mild calcification but no evidence of aneurysm.  Patient is currently without any abdominal tenderness. She is afebrile with stable vital sign. Given her age, and her presenting complaint, I offer to obtain labs, give her IV fluid, and also perform an abdominal and pelvis CT scan to rule out for acute emergent pathology such as appendicitis or colitis  4:29 PM Labs are reassuring.  Pt sts her diarrhea is improving.  She does not want additional imaging such as CT scan. SHe will f/u with her PCP for further care.  Care discussed with Dr. Fredderick Phenix.   Fayrene Helper, PA-C 07/02/15 1630  Rolan Bucco, MD 07/02/15  1740

## 2015-07-02 NOTE — ED Notes (Signed)
Amber, NT notified me that pt's BP had gone up.  Pt states that this is because she has not taken her BP meds today.  Pt given water so that she can take her meds.

## 2015-07-02 NOTE — Discharge Instructions (Signed)
Please follow up with your doctor for further management of your diarrhea.  Continue taking imodium as previously prescribed.  Stay hydrated.   Diarrhea Diarrhea is watery poop (stool). It can make you feel weak, tired, thirsty, or give you a dry mouth (signs of dehydration). Watery poop is a sign of another problem, most often an infection. It often lasts 2-3 days. It can last longer if it is a sign of something serious. Take care of yourself as told by your doctor. HOME CARE   Drink 1 cup (8 ounces) of fluid each time you have watery poop.  Do not drink the following fluids:  Those that contain simple sugars (fructose, glucose, galactose, lactose, sucrose, maltose).  Sports drinks.  Fruit juices.  Whole milk products.  Sodas.  Drinks with caffeine (coffee, tea, soda) or alcohol.  Oral rehydration solution may be used if the doctor says it is okay. You may make your own solution. Follow this recipe:   - teaspoon table salt.   teaspoon baking soda.   teaspoon salt substitute containing potassium chloride.  1 tablespoons sugar.  1 liter (34 ounces) of water.  Avoid the following foods:  High fiber foods, such as raw fruits and vegetables.  Nuts, seeds, and whole grain breads and cereals.   Those that are sweetened with sugar alcohols (xylitol, sorbitol, mannitol).  Try eating the following foods:  Starchy foods, such as rice, toast, pasta, low-sugar cereal, oatmeal, baked potatoes, crackers, and bagels.  Bananas.  Applesauce.  Eat probiotic-rich foods, such as yogurt and milk products that are fermented.  Wash your hands well after each time you have watery poop.  Only take medicine as told by your doctor.  Take a warm bath to help lessen burning or pain from having watery poop. GET HELP RIGHT AWAY IF:   You cannot drink fluids without throwing up (vomiting).  You keep throwing up.  You have blood in your poop, or your poop looks black and tarry.  You  do not pee (urinate) in 6-8 hours, or there is only a small amount of very dark pee.  You have belly (abdominal) pain that gets worse or stays in the same spot (localizes).  You are weak, dizzy, confused, or light-headed.  You have a very bad headache.  Your watery poop gets worse or does not get better.  You have a fever or lasting symptoms for more than 2-3 days.  You have a fever and your symptoms suddenly get worse. MAKE SURE YOU:   Understand these instructions.  Will watch your condition.  Will get help right away if you are not doing well or get worse.   This information is not intended to replace advice given to you by your health care provider. Make sure you discuss any questions you have with your health care provider.   Document Released: 07/13/2007 Document Revised: 02/14/2014 Document Reviewed: 10/02/2011 Elsevier Interactive Patient Education [employer]

## 2015-07-10 ENCOUNTER — Other Ambulatory Visit: Payer: Self-pay | Admitting: Internal Medicine

## 2015-07-10 DIAGNOSIS — I714 Abdominal aortic aneurysm, without rupture, unspecified: Secondary | ICD-10-CM

## 2015-07-30 DIAGNOSIS — M130 Polyarthritis, unspecified: Secondary | ICD-10-CM | POA: Diagnosis not present

## 2015-07-30 DIAGNOSIS — Z79899 Other long term (current) drug therapy: Secondary | ICD-10-CM | POA: Diagnosis not present

## 2015-07-30 DIAGNOSIS — M0579 Rheumatoid arthritis with rheumatoid factor of multiple sites without organ or systems involvement: Secondary | ICD-10-CM | POA: Diagnosis not present

## 2015-08-04 DIAGNOSIS — K8020 Calculus of gallbladder without cholecystitis without obstruction: Secondary | ICD-10-CM | POA: Diagnosis not present

## 2015-08-04 DIAGNOSIS — M0579 Rheumatoid arthritis with rheumatoid factor of multiple sites without organ or systems involvement: Secondary | ICD-10-CM | POA: Diagnosis not present

## 2015-08-04 DIAGNOSIS — E039 Hypothyroidism, unspecified: Secondary | ICD-10-CM | POA: Diagnosis not present

## 2015-08-04 DIAGNOSIS — I10 Essential (primary) hypertension: Secondary | ICD-10-CM | POA: Diagnosis not present

## 2015-08-04 DIAGNOSIS — M059 Rheumatoid arthritis with rheumatoid factor, unspecified: Secondary | ICD-10-CM | POA: Diagnosis not present

## 2015-10-13 ENCOUNTER — Other Ambulatory Visit: Payer: Self-pay

## 2015-10-13 DIAGNOSIS — R42 Dizziness and giddiness: Secondary | ICD-10-CM | POA: Diagnosis not present

## 2015-10-13 DIAGNOSIS — H8110 Benign paroxysmal vertigo, unspecified ear: Secondary | ICD-10-CM | POA: Diagnosis not present

## 2015-10-23 DIAGNOSIS — Z23 Encounter for immunization: Secondary | ICD-10-CM | POA: Diagnosis not present

## 2015-11-25 DIAGNOSIS — I10 Essential (primary) hypertension: Secondary | ICD-10-CM | POA: Diagnosis not present

## 2015-11-25 DIAGNOSIS — E559 Vitamin D deficiency, unspecified: Secondary | ICD-10-CM | POA: Diagnosis not present

## 2015-11-25 DIAGNOSIS — M8580 Other specified disorders of bone density and structure, unspecified site: Secondary | ICD-10-CM | POA: Diagnosis not present

## 2015-12-02 DIAGNOSIS — R112 Nausea with vomiting, unspecified: Secondary | ICD-10-CM | POA: Diagnosis not present

## 2015-12-02 DIAGNOSIS — M0579 Rheumatoid arthritis with rheumatoid factor of multiple sites without organ or systems involvement: Secondary | ICD-10-CM | POA: Diagnosis not present

## 2015-12-02 DIAGNOSIS — I10 Essential (primary) hypertension: Secondary | ICD-10-CM | POA: Diagnosis not present

## 2015-12-07 DIAGNOSIS — R197 Diarrhea, unspecified: Secondary | ICD-10-CM | POA: Diagnosis not present

## 2015-12-07 DIAGNOSIS — R1110 Vomiting, unspecified: Secondary | ICD-10-CM | POA: Diagnosis not present

## 2015-12-07 DIAGNOSIS — M0570 Rheumatoid arthritis with rheumatoid factor of unspecified site without organ or systems involvement: Secondary | ICD-10-CM | POA: Diagnosis not present

## 2015-12-17 DIAGNOSIS — I10 Essential (primary) hypertension: Secondary | ICD-10-CM | POA: Diagnosis not present

## 2015-12-17 DIAGNOSIS — H8110 Benign paroxysmal vertigo, unspecified ear: Secondary | ICD-10-CM | POA: Diagnosis not present

## 2015-12-17 DIAGNOSIS — R42 Dizziness and giddiness: Secondary | ICD-10-CM | POA: Diagnosis not present

## 2015-12-21 DIAGNOSIS — I10 Essential (primary) hypertension: Secondary | ICD-10-CM | POA: Diagnosis not present

## 2015-12-21 DIAGNOSIS — R42 Dizziness and giddiness: Secondary | ICD-10-CM | POA: Diagnosis not present

## 2015-12-22 ENCOUNTER — Other Ambulatory Visit: Payer: Self-pay | Admitting: Internal Medicine

## 2015-12-22 DIAGNOSIS — R42 Dizziness and giddiness: Secondary | ICD-10-CM

## 2015-12-30 ENCOUNTER — Other Ambulatory Visit: Payer: BLUE CROSS/BLUE SHIELD

## 2016-01-19 DIAGNOSIS — M0579 Rheumatoid arthritis with rheumatoid factor of multiple sites without organ or systems involvement: Secondary | ICD-10-CM | POA: Diagnosis not present

## 2016-01-19 DIAGNOSIS — I10 Essential (primary) hypertension: Secondary | ICD-10-CM | POA: Diagnosis not present

## 2016-01-19 DIAGNOSIS — M8580 Other specified disorders of bone density and structure, unspecified site: Secondary | ICD-10-CM | POA: Diagnosis not present

## 2016-01-19 DIAGNOSIS — R42 Dizziness and giddiness: Secondary | ICD-10-CM | POA: Diagnosis not present

## 2016-01-28 DIAGNOSIS — Z1231 Encounter for screening mammogram for malignant neoplasm of breast: Secondary | ICD-10-CM | POA: Diagnosis not present

## 2016-02-15 DIAGNOSIS — Z79899 Other long term (current) drug therapy: Secondary | ICD-10-CM | POA: Diagnosis not present

## 2016-02-15 DIAGNOSIS — M0579 Rheumatoid arthritis with rheumatoid factor of multiple sites without organ or systems involvement: Secondary | ICD-10-CM | POA: Diagnosis not present

## 2016-03-14 DIAGNOSIS — Z79899 Other long term (current) drug therapy: Secondary | ICD-10-CM | POA: Diagnosis not present

## 2016-03-14 DIAGNOSIS — M25569 Pain in unspecified knee: Secondary | ICD-10-CM | POA: Diagnosis not present

## 2016-03-14 DIAGNOSIS — M0579 Rheumatoid arthritis with rheumatoid factor of multiple sites without organ or systems involvement: Secondary | ICD-10-CM | POA: Diagnosis not present

## 2016-06-07 DIAGNOSIS — M810 Age-related osteoporosis without current pathological fracture: Secondary | ICD-10-CM | POA: Diagnosis not present

## 2016-06-07 DIAGNOSIS — I10 Essential (primary) hypertension: Secondary | ICD-10-CM | POA: Diagnosis not present

## 2016-06-13 DIAGNOSIS — M0579 Rheumatoid arthritis with rheumatoid factor of multiple sites without organ or systems involvement: Secondary | ICD-10-CM | POA: Diagnosis not present

## 2016-06-13 DIAGNOSIS — Z13820 Encounter for screening for osteoporosis: Secondary | ICD-10-CM | POA: Diagnosis not present

## 2016-06-13 DIAGNOSIS — Z79899 Other long term (current) drug therapy: Secondary | ICD-10-CM | POA: Diagnosis not present

## 2016-06-13 DIAGNOSIS — M25569 Pain in unspecified knee: Secondary | ICD-10-CM | POA: Diagnosis not present

## 2016-06-14 DIAGNOSIS — Z5181 Encounter for therapeutic drug level monitoring: Secondary | ICD-10-CM | POA: Diagnosis not present

## 2016-06-14 DIAGNOSIS — M8580 Other specified disorders of bone density and structure, unspecified site: Secondary | ICD-10-CM | POA: Diagnosis not present

## 2016-06-14 DIAGNOSIS — Z79899 Other long term (current) drug therapy: Secondary | ICD-10-CM | POA: Diagnosis not present

## 2016-06-14 DIAGNOSIS — M0579 Rheumatoid arthritis with rheumatoid factor of multiple sites without organ or systems involvement: Secondary | ICD-10-CM | POA: Diagnosis not present

## 2016-06-14 DIAGNOSIS — R600 Localized edema: Secondary | ICD-10-CM | POA: Diagnosis not present

## 2016-06-16 ENCOUNTER — Other Ambulatory Visit: Payer: Self-pay | Admitting: Internal Medicine

## 2016-06-16 DIAGNOSIS — R600 Localized edema: Secondary | ICD-10-CM

## 2016-06-22 ENCOUNTER — Ambulatory Visit
Admission: RE | Admit: 2016-06-22 | Discharge: 2016-06-22 | Disposition: A | Discharge status: Home / Self Care | Payer: Medicare Other | Source: Ambulatory Visit | Attending: Internal Medicine | Admitting: Internal Medicine

## 2016-06-22 DIAGNOSIS — R600 Localized edema: Secondary | ICD-10-CM | POA: Diagnosis not present

## 2016-07-18 DIAGNOSIS — R609 Edema, unspecified: Secondary | ICD-10-CM | POA: Diagnosis not present

## 2016-07-18 DIAGNOSIS — M0579 Rheumatoid arthritis with rheumatoid factor of multiple sites without organ or systems involvement: Secondary | ICD-10-CM | POA: Diagnosis not present

## 2016-07-18 DIAGNOSIS — Z79899 Other long term (current) drug therapy: Secondary | ICD-10-CM | POA: Diagnosis not present

## 2016-07-18 DIAGNOSIS — E039 Hypothyroidism, unspecified: Secondary | ICD-10-CM | POA: Diagnosis not present

## 2016-08-05 DIAGNOSIS — Z79899 Other long term (current) drug therapy: Secondary | ICD-10-CM | POA: Diagnosis not present

## 2016-08-05 DIAGNOSIS — Z5181 Encounter for therapeutic drug level monitoring: Secondary | ICD-10-CM | POA: Diagnosis not present

## 2016-08-05 DIAGNOSIS — I10 Essential (primary) hypertension: Secondary | ICD-10-CM | POA: Diagnosis not present

## 2016-08-05 DIAGNOSIS — E039 Hypothyroidism, unspecified: Secondary | ICD-10-CM | POA: Diagnosis not present

## 2016-08-05 DIAGNOSIS — M0579 Rheumatoid arthritis with rheumatoid factor of multiple sites without organ or systems involvement: Secondary | ICD-10-CM | POA: Diagnosis not present

## 2016-08-16 DIAGNOSIS — I10 Essential (primary) hypertension: Secondary | ICD-10-CM | POA: Diagnosis not present

## 2016-08-16 DIAGNOSIS — Z79899 Other long term (current) drug therapy: Secondary | ICD-10-CM | POA: Diagnosis not present

## 2016-08-16 DIAGNOSIS — R609 Edema, unspecified: Secondary | ICD-10-CM | POA: Diagnosis not present

## 2016-08-16 DIAGNOSIS — M0579 Rheumatoid arthritis with rheumatoid factor of multiple sites without organ or systems involvement: Secondary | ICD-10-CM | POA: Diagnosis not present

## 2016-10-12 DIAGNOSIS — M810 Age-related osteoporosis without current pathological fracture: Secondary | ICD-10-CM | POA: Diagnosis not present

## 2016-10-12 DIAGNOSIS — M0579 Rheumatoid arthritis with rheumatoid factor of multiple sites without organ or systems involvement: Secondary | ICD-10-CM | POA: Diagnosis not present

## 2016-10-12 DIAGNOSIS — M25569 Pain in unspecified knee: Secondary | ICD-10-CM | POA: Diagnosis not present

## 2016-10-12 DIAGNOSIS — Z79899 Other long term (current) drug therapy: Secondary | ICD-10-CM | POA: Diagnosis not present

## 2016-11-18 DIAGNOSIS — Z23 Encounter for immunization: Secondary | ICD-10-CM | POA: Diagnosis not present

## 2017-01-27 DIAGNOSIS — Z1231 Encounter for screening mammogram for malignant neoplasm of breast: Secondary | ICD-10-CM | POA: Diagnosis not present

## 2017-02-09 DIAGNOSIS — E039 Hypothyroidism, unspecified: Secondary | ICD-10-CM | POA: Diagnosis not present

## 2017-02-09 DIAGNOSIS — M0579 Rheumatoid arthritis with rheumatoid factor of multiple sites without organ or systems involvement: Secondary | ICD-10-CM | POA: Diagnosis not present

## 2017-02-09 DIAGNOSIS — I10 Essential (primary) hypertension: Secondary | ICD-10-CM | POA: Diagnosis not present

## 2017-02-16 DIAGNOSIS — R634 Abnormal weight loss: Secondary | ICD-10-CM | POA: Diagnosis not present

## 2017-02-16 DIAGNOSIS — Z0000 Encounter for general adult medical examination without abnormal findings: Secondary | ICD-10-CM | POA: Diagnosis not present

## 2017-02-16 DIAGNOSIS — J301 Allergic rhinitis due to pollen: Secondary | ICD-10-CM | POA: Diagnosis not present

## 2017-02-16 DIAGNOSIS — Z79899 Other long term (current) drug therapy: Secondary | ICD-10-CM | POA: Diagnosis not present

## 2017-02-16 DIAGNOSIS — M059 Rheumatoid arthritis with rheumatoid factor, unspecified: Secondary | ICD-10-CM | POA: Diagnosis not present

## 2017-02-21 DIAGNOSIS — Z79899 Other long term (current) drug therapy: Secondary | ICD-10-CM | POA: Diagnosis not present

## 2017-02-21 DIAGNOSIS — M25569 Pain in unspecified knee: Secondary | ICD-10-CM | POA: Diagnosis not present

## 2017-02-21 DIAGNOSIS — M0579 Rheumatoid arthritis with rheumatoid factor of multiple sites without organ or systems involvement: Secondary | ICD-10-CM | POA: Diagnosis not present

## 2017-02-21 DIAGNOSIS — M810 Age-related osteoporosis without current pathological fracture: Secondary | ICD-10-CM | POA: Diagnosis not present

## 2017-05-23 DIAGNOSIS — Z79899 Other long term (current) drug therapy: Secondary | ICD-10-CM | POA: Diagnosis not present

## 2017-05-23 DIAGNOSIS — M25569 Pain in unspecified knee: Secondary | ICD-10-CM | POA: Diagnosis not present

## 2017-05-23 DIAGNOSIS — M810 Age-related osteoporosis without current pathological fracture: Secondary | ICD-10-CM | POA: Diagnosis not present

## 2017-05-23 DIAGNOSIS — M0579 Rheumatoid arthritis with rheumatoid factor of multiple sites without organ or systems involvement: Secondary | ICD-10-CM | POA: Diagnosis not present

## 2017-08-16 DIAGNOSIS — Z5181 Encounter for therapeutic drug level monitoring: Secondary | ICD-10-CM | POA: Diagnosis not present

## 2017-08-16 DIAGNOSIS — Z79899 Other long term (current) drug therapy: Secondary | ICD-10-CM | POA: Diagnosis not present

## 2017-08-16 DIAGNOSIS — M059 Rheumatoid arthritis with rheumatoid factor, unspecified: Secondary | ICD-10-CM | POA: Diagnosis not present

## 2017-08-22 DIAGNOSIS — M810 Age-related osteoporosis without current pathological fracture: Secondary | ICD-10-CM | POA: Diagnosis not present

## 2017-08-22 DIAGNOSIS — Z79899 Other long term (current) drug therapy: Secondary | ICD-10-CM | POA: Diagnosis not present

## 2017-08-22 DIAGNOSIS — R112 Nausea with vomiting, unspecified: Secondary | ICD-10-CM | POA: Diagnosis not present

## 2017-08-22 DIAGNOSIS — T50905A Adverse effect of unspecified drugs, medicaments and biological substances, initial encounter: Secondary | ICD-10-CM | POA: Diagnosis not present

## 2017-08-22 DIAGNOSIS — M0579 Rheumatoid arthritis with rheumatoid factor of multiple sites without organ or systems involvement: Secondary | ICD-10-CM | POA: Diagnosis not present

## 2017-08-23 DIAGNOSIS — E039 Hypothyroidism, unspecified: Secondary | ICD-10-CM | POA: Diagnosis not present

## 2017-08-23 DIAGNOSIS — M0579 Rheumatoid arthritis with rheumatoid factor of multiple sites without organ or systems involvement: Secondary | ICD-10-CM | POA: Diagnosis not present

## 2017-08-23 DIAGNOSIS — I10 Essential (primary) hypertension: Secondary | ICD-10-CM | POA: Diagnosis not present

## 2017-08-23 DIAGNOSIS — R634 Abnormal weight loss: Secondary | ICD-10-CM | POA: Diagnosis not present

## 2017-08-23 DIAGNOSIS — Z0000 Encounter for general adult medical examination without abnormal findings: Secondary | ICD-10-CM | POA: Diagnosis not present

## 2017-08-23 DIAGNOSIS — J301 Allergic rhinitis due to pollen: Secondary | ICD-10-CM | POA: Diagnosis not present

## 2017-09-27 DIAGNOSIS — Z79899 Other long term (current) drug therapy: Secondary | ICD-10-CM | POA: Diagnosis not present

## 2017-09-27 DIAGNOSIS — M810 Age-related osteoporosis without current pathological fracture: Secondary | ICD-10-CM | POA: Diagnosis not present

## 2017-09-27 DIAGNOSIS — M0579 Rheumatoid arthritis with rheumatoid factor of multiple sites without organ or systems involvement: Secondary | ICD-10-CM | POA: Diagnosis not present

## 2017-09-27 DIAGNOSIS — R112 Nausea with vomiting, unspecified: Secondary | ICD-10-CM | POA: Diagnosis not present

## 2017-09-27 DIAGNOSIS — T50905A Adverse effect of unspecified drugs, medicaments and biological substances, initial encounter: Secondary | ICD-10-CM | POA: Diagnosis not present

## 2017-10-13 DIAGNOSIS — Z23 Encounter for immunization: Secondary | ICD-10-CM | POA: Diagnosis not present

## 2017-11-02 DIAGNOSIS — R634 Abnormal weight loss: Secondary | ICD-10-CM | POA: Diagnosis not present

## 2017-11-02 DIAGNOSIS — I10 Essential (primary) hypertension: Secondary | ICD-10-CM | POA: Diagnosis not present

## 2017-12-28 DIAGNOSIS — Z79899 Other long term (current) drug therapy: Secondary | ICD-10-CM | POA: Diagnosis not present

## 2017-12-28 DIAGNOSIS — M810 Age-related osteoporosis without current pathological fracture: Secondary | ICD-10-CM | POA: Diagnosis not present

## 2017-12-28 DIAGNOSIS — R112 Nausea with vomiting, unspecified: Secondary | ICD-10-CM | POA: Diagnosis not present

## 2017-12-28 DIAGNOSIS — M0579 Rheumatoid arthritis with rheumatoid factor of multiple sites without organ or systems involvement: Secondary | ICD-10-CM | POA: Diagnosis not present

## 2017-12-28 DIAGNOSIS — T50905A Adverse effect of unspecified drugs, medicaments and biological substances, initial encounter: Secondary | ICD-10-CM | POA: Diagnosis not present

## 2018-02-01 DIAGNOSIS — Z1231 Encounter for screening mammogram for malignant neoplasm of breast: Secondary | ICD-10-CM | POA: Diagnosis not present

## 2018-02-13 DIAGNOSIS — I10 Essential (primary) hypertension: Secondary | ICD-10-CM | POA: Diagnosis not present

## 2018-02-22 DIAGNOSIS — I10 Essential (primary) hypertension: Secondary | ICD-10-CM | POA: Diagnosis not present

## 2018-02-22 DIAGNOSIS — Z0000 Encounter for general adult medical examination without abnormal findings: Secondary | ICD-10-CM | POA: Diagnosis not present

## 2018-02-22 DIAGNOSIS — M0579 Rheumatoid arthritis with rheumatoid factor of multiple sites without organ or systems involvement: Secondary | ICD-10-CM | POA: Diagnosis not present

## 2018-02-22 DIAGNOSIS — K219 Gastro-esophageal reflux disease without esophagitis: Secondary | ICD-10-CM | POA: Diagnosis not present

## 2018-05-02 DIAGNOSIS — T50905A Adverse effect of unspecified drugs, medicaments and biological substances, initial encounter: Secondary | ICD-10-CM | POA: Diagnosis not present

## 2018-05-02 DIAGNOSIS — M0579 Rheumatoid arthritis with rheumatoid factor of multiple sites without organ or systems involvement: Secondary | ICD-10-CM | POA: Diagnosis not present

## 2018-05-02 DIAGNOSIS — M810 Age-related osteoporosis without current pathological fracture: Secondary | ICD-10-CM | POA: Diagnosis not present

## 2018-05-02 DIAGNOSIS — Z79899 Other long term (current) drug therapy: Secondary | ICD-10-CM | POA: Diagnosis not present

## 2018-08-02 DIAGNOSIS — I10 Essential (primary) hypertension: Secondary | ICD-10-CM | POA: Diagnosis not present

## 2018-08-02 DIAGNOSIS — M810 Age-related osteoporosis without current pathological fracture: Secondary | ICD-10-CM | POA: Diagnosis not present

## 2018-08-02 DIAGNOSIS — I38 Endocarditis, valve unspecified: Secondary | ICD-10-CM | POA: Diagnosis not present

## 2018-08-02 DIAGNOSIS — Z79899 Other long term (current) drug therapy: Secondary | ICD-10-CM | POA: Diagnosis not present

## 2018-08-02 DIAGNOSIS — T50905A Adverse effect of unspecified drugs, medicaments and biological substances, initial encounter: Secondary | ICD-10-CM | POA: Diagnosis not present

## 2018-08-02 DIAGNOSIS — E039 Hypothyroidism, unspecified: Secondary | ICD-10-CM | POA: Diagnosis not present

## 2018-08-02 DIAGNOSIS — M0579 Rheumatoid arthritis with rheumatoid factor of multiple sites without organ or systems involvement: Secondary | ICD-10-CM | POA: Diagnosis not present

## 2018-09-05 ENCOUNTER — Other Ambulatory Visit: Payer: Self-pay

## 2018-09-10 DIAGNOSIS — H109 Unspecified conjunctivitis: Secondary | ICD-10-CM | POA: Diagnosis not present

## 2018-09-10 DIAGNOSIS — Z7189 Other specified counseling: Secondary | ICD-10-CM | POA: Diagnosis not present

## 2018-10-19 DIAGNOSIS — Z23 Encounter for immunization: Secondary | ICD-10-CM | POA: Diagnosis not present

## 2019-02-07 DIAGNOSIS — Z1231 Encounter for screening mammogram for malignant neoplasm of breast: Secondary | ICD-10-CM | POA: Diagnosis not present

## 2019-02-26 DIAGNOSIS — Z780 Asymptomatic menopausal state: Secondary | ICD-10-CM | POA: Diagnosis not present

## 2019-02-26 DIAGNOSIS — I10 Essential (primary) hypertension: Secondary | ICD-10-CM | POA: Diagnosis not present

## 2019-02-26 DIAGNOSIS — Z0000 Encounter for general adult medical examination without abnormal findings: Secondary | ICD-10-CM | POA: Diagnosis not present

## 2019-03-11 DIAGNOSIS — M8589 Other specified disorders of bone density and structure, multiple sites: Secondary | ICD-10-CM | POA: Diagnosis not present

## 2019-03-11 DIAGNOSIS — I10 Essential (primary) hypertension: Secondary | ICD-10-CM | POA: Diagnosis not present

## 2019-03-14 DIAGNOSIS — I10 Essential (primary) hypertension: Secondary | ICD-10-CM | POA: Diagnosis not present

## 2019-03-14 DIAGNOSIS — M0579 Rheumatoid arthritis with rheumatoid factor of multiple sites without organ or systems involvement: Secondary | ICD-10-CM | POA: Diagnosis not present

## 2019-03-14 DIAGNOSIS — K219 Gastro-esophageal reflux disease without esophagitis: Secondary | ICD-10-CM | POA: Diagnosis not present

## 2019-04-03 DIAGNOSIS — Z23 Encounter for immunization: Secondary | ICD-10-CM | POA: Diagnosis not present

## 2019-04-11 DIAGNOSIS — M0579 Rheumatoid arthritis with rheumatoid factor of multiple sites without organ or systems involvement: Secondary | ICD-10-CM | POA: Diagnosis not present

## 2019-04-11 DIAGNOSIS — I10 Essential (primary) hypertension: Secondary | ICD-10-CM | POA: Diagnosis not present

## 2019-05-01 DIAGNOSIS — Z23 Encounter for immunization: Secondary | ICD-10-CM | POA: Diagnosis not present

## 2019-05-20 ENCOUNTER — Other Ambulatory Visit: Payer: Self-pay

## 2019-05-20 ENCOUNTER — Emergency Department (HOSPITAL_COMMUNITY)
Admission: EM | Admit: 2019-05-20 | Discharge: 2019-05-20 | Disposition: A | Discharge status: Home / Self Care | Payer: Medicare Other | Attending: Emergency Medicine | Admitting: Emergency Medicine

## 2019-05-20 ENCOUNTER — Encounter (HOSPITAL_COMMUNITY): Payer: Self-pay | Admitting: Emergency Medicine

## 2019-05-20 DIAGNOSIS — Z79899 Other long term (current) drug therapy: Secondary | ICD-10-CM | POA: Diagnosis not present

## 2019-05-20 DIAGNOSIS — M069 Rheumatoid arthritis, unspecified: Secondary | ICD-10-CM | POA: Diagnosis not present

## 2019-05-20 DIAGNOSIS — L299 Pruritus, unspecified: Secondary | ICD-10-CM | POA: Diagnosis not present

## 2019-05-20 DIAGNOSIS — L298 Other pruritus: Secondary | ICD-10-CM | POA: Diagnosis not present

## 2019-05-20 DIAGNOSIS — I10 Essential (primary) hypertension: Secondary | ICD-10-CM | POA: Diagnosis not present

## 2019-05-20 DIAGNOSIS — R21 Rash and other nonspecific skin eruption: Secondary | ICD-10-CM | POA: Diagnosis not present

## 2019-05-20 MED ORDER — CLOTRIMAZOLE 1 % EX CREA: TOPICAL_CREAM | CUTANEOUS | 0 refills | Status: DC

## 2019-05-20 NOTE — ED Triage Notes (Signed)
Pt c/o right foot itching under toes since Friday.

## 2019-05-20 NOTE — ED Provider Notes (Signed)
[facility] DEPT Provider Note   CSN: [id number] Arrival date & time: 05/20/19  3016     History Chief Complaint  Patient presents with  . Pruritis    Sandra Torres is a 79 y.o. female with a past medical history of HTN, rheumatoid arthritis presents today for evaluation of intermittent pins-and-needles feeling in her right foot since Friday.  She reports that it feels better when she strikes her foot on the ground.  She denies any fevers or trauma.  She reports that it will come and last for a few minutes before going away.  She has never had this before.  She denies any new medications.  She states that she went to urgent care earlier today and they gave her triamcinolone cream and hydroxyzine, 10 mg, which she has tried without significant relief.  She denies any fevers, rashes, or wounds.      HPI     Past Medical History:  Diagnosis Date  . Hypertension   . Rheumatoid arthritis [facility])     Patient Active Problem List   Diagnosis Date Noted  . Hypomagnesemia 03/01/2013  . Influenza with other respiratory manifestations 03/01/2013  . Febrile illness, acute 02/28/2013  . Hypertension 02/28/2013  . Fall 02/28/2013  . Dehydration 02/28/2013  . Acute febrile illness 02/28/2013  . Abnormal CXR 02/28/2013  . Fever of unknown origin 02/28/2013    Past Surgical History:  Procedure Laterality Date  . JOINT REPLACEMENT    . TOTAL HIP ARTHROPLASTY Left   . TOTAL KNEE ARTHROPLASTY Bilateral   . TOTAL SHOULDER REPLACEMENT Left      OB History   No obstetric history on file.     Family History  Problem Relation Age of Onset  . Hypertension Neg Hx   . Heart disease Neg Hx   . Stroke Neg Hx   . Cancer Neg Hx     Social History   Tobacco Use  . Smoking status: Former Smoker  Substance Use Topics  . Alcohol use: No  . Drug use: No    Home Medications Prior to Admission medications   Medication Sig Start Date End Date Taking?  Authorizing Provider  bisoprolol (ZEBETA) 5 MG tablet Take 5 mg by mouth daily.    [provider]  Cholecalciferol (VITAMIN D PO) Take 2,000 Units by mouth daily.     [provider]  clotrimazole (LOTRIMIN) 1 % cream Apply to affected area 2 times daily 05/20/19   Lorin Glass, PA-C  folic acid (FOLVITE) 1 MG tablet Take 1 mg by mouth daily.    [provider]  HYDROcodone-acetaminophen (NORCO) 10-325 MG per tablet Take 1 tablet by mouth every 6 (six) hours as needed for moderate pain. As directed.    [provider]  methotrexate (RHEUMATREX) 2.5 MG tablet Take 17.5 mg by mouth once a week. 04/09/15   [provider]  Multiple Vitamin (MULTIVITAMIN WITH MINERALS) TABS tablet Take 1 tablet by mouth daily.    [provider]  omega-3 acid ethyl esters (LOVAZA) 1 G capsule Take 1 g by mouth daily.    [provider]  predniSONE (DELTASONE) 1 MG tablet Take 4 mg by mouth daily.    [provider]    Allergies    Patient has no known allergies.  Review of Systems   Review of Systems  Constitutional: Negative for chills and fever.  Neurological: Negative for weakness and numbness.  Subjective pins and needles in her right foot.   All other systems reviewed and are negative.   Physical Exam Updated Vital Signs BP (!) 167/101 (BP Location: Right Arm)   Pulse 89   Temp 98.1 F (36.7 C) (Oral)   Resp 19   SpO2 99%   Physical Exam Vitals and nursing note reviewed.  Constitutional:      General: She is not in acute distress.    Appearance: She is not ill-appearing.  HENT:     Head: Normocephalic.  Cardiovascular:     Rate and Rhythm: Normal rate.     Pulses: Normal pulses.     Comments: Right foot is warm, capillary refill is under 2 seconds.  2+ right DP/PT pulse. Pulmonary:     Effort: Pulmonary effort is normal. No respiratory distress.  Musculoskeletal:     Comments: Patient is able to ambulate  and bear weight on the right foot.  No significant crepitus or deformities palpated or visualized on the right foot.    Skin:    Comments: No significant erythema, induration, ecchymosis, marks, or lesions on the plantar surface of the right foot.  There is occasional white scale primarily in between the toes on the right foot.  Neurological:     Mental Status: She is alert and oriented to person, place, and time.     Comments: Patient is intact to light touch to right foot.  5/5 strength right ankle dorsiflexion and plantar flexion.  Psychiatric:        Mood and Affect: Mood normal.     ED Results / Procedures / Treatments   Labs (all labs ordered are listed, but only abnormal results are displayed) Labs Reviewed - No data to display  EKG None  Radiology No results found.  Procedures Procedures (including critical care time)  Medications Ordered in ED Medications - No data to display  ED Course  I have reviewed the triage vital signs and the nursing notes.  Pertinent labs & imaging results that were available during my care of the patient were reviewed by me and considered in my medical decision making (see chart for details).    MDM Rules/Calculators/A&P                     Sandra Torres presents today for itching along the ball of her right foot.  On exam she does have some skin changes the slightly concerning for athlete's foot, and is given a prescription for Lotrimin .  She had previously been given prescription for hydroxyzine, however reports that that is not helping therefore recommend she stop this.  I suspect that she may have a degree of paresthesias.  She has intact circulation and her symptoms are intermittent, I do not suspect a serious or life-threatening cause for her symptoms.  As she is unable to see her primary care doctor recommended that she consider follow-up with podiatry.  Return precautions were discussed with patient who states their  understanding.  At the time of discharge patient denied any unaddressed complaints or concerns.  Patient is agreeable for discharge home.  Note: Portions of this report may have been transcribed using voice recognition software. Every effort was made to ensure accuracy; however, inadvertent computerized transcription errors may be present  Final Clinical Impression(s) / ED Diagnoses Final diagnoses:  Pruritus    Rx / DC Orders ED Discharge Orders         Ordered    clotrimazole (LOTRIMIN)  1 % cream     05/20/19 1911           Norman Clay 05/20/19 2143    Geoffery Lyons, MD 05/20/19 2329

## 2019-05-20 NOTE — Discharge Instructions (Signed)
Today I have given you a cream for your foot.  Please consider following up with a podiatrist if your primary care doctor can't see you.

## 2019-06-05 DIAGNOSIS — M79643 Pain in unspecified hand: Secondary | ICD-10-CM | POA: Diagnosis not present

## 2019-06-05 DIAGNOSIS — M0579 Rheumatoid arthritis with rheumatoid factor of multiple sites without organ or systems involvement: Secondary | ICD-10-CM | POA: Diagnosis not present

## 2019-06-05 DIAGNOSIS — I10 Essential (primary) hypertension: Secondary | ICD-10-CM | POA: Diagnosis not present

## 2019-06-05 DIAGNOSIS — T50905A Adverse effect of unspecified drugs, medicaments and biological substances, initial encounter: Secondary | ICD-10-CM | POA: Diagnosis not present

## 2019-06-05 DIAGNOSIS — E039 Hypothyroidism, unspecified: Secondary | ICD-10-CM | POA: Diagnosis not present

## 2019-06-05 DIAGNOSIS — Z79899 Other long term (current) drug therapy: Secondary | ICD-10-CM | POA: Diagnosis not present

## 2019-06-05 DIAGNOSIS — I38 Endocarditis, valve unspecified: Secondary | ICD-10-CM | POA: Diagnosis not present

## 2019-06-05 DIAGNOSIS — M810 Age-related osteoporosis without current pathological fracture: Secondary | ICD-10-CM | POA: Diagnosis not present

## 2019-07-02 DIAGNOSIS — M05641 Rheumatoid arthritis of right hand with involvement of other organs and systems: Secondary | ICD-10-CM | POA: Diagnosis not present

## 2019-07-02 DIAGNOSIS — M79642 Pain in left hand: Secondary | ICD-10-CM | POA: Diagnosis not present

## 2019-07-02 DIAGNOSIS — M79641 Pain in right hand: Secondary | ICD-10-CM | POA: Diagnosis not present

## 2019-07-16 DIAGNOSIS — M79641 Pain in right hand: Secondary | ICD-10-CM | POA: Diagnosis not present

## 2019-07-16 DIAGNOSIS — M05641 Rheumatoid arthritis of right hand with involvement of other organs and systems: Secondary | ICD-10-CM | POA: Diagnosis not present

## 2019-07-16 DIAGNOSIS — M79642 Pain in left hand: Secondary | ICD-10-CM | POA: Diagnosis not present

## 2019-07-22 DIAGNOSIS — M79641 Pain in right hand: Secondary | ICD-10-CM | POA: Diagnosis not present

## 2019-08-19 DIAGNOSIS — K219 Gastro-esophageal reflux disease without esophagitis: Secondary | ICD-10-CM | POA: Diagnosis not present

## 2019-08-19 DIAGNOSIS — M0579 Rheumatoid arthritis with rheumatoid factor of multiple sites without organ or systems involvement: Secondary | ICD-10-CM | POA: Diagnosis not present

## 2019-08-19 DIAGNOSIS — I10 Essential (primary) hypertension: Secondary | ICD-10-CM | POA: Diagnosis not present

## 2019-08-19 DIAGNOSIS — L299 Pruritus, unspecified: Secondary | ICD-10-CM | POA: Diagnosis not present

## 2019-11-01 DIAGNOSIS — Z23 Encounter for immunization: Secondary | ICD-10-CM | POA: Diagnosis not present

## 2019-11-21 DIAGNOSIS — I10 Essential (primary) hypertension: Secondary | ICD-10-CM | POA: Diagnosis not present

## 2019-11-21 DIAGNOSIS — K219 Gastro-esophageal reflux disease without esophagitis: Secondary | ICD-10-CM | POA: Diagnosis not present

## 2019-11-21 DIAGNOSIS — R7301 Impaired fasting glucose: Secondary | ICD-10-CM | POA: Diagnosis not present

## 2019-11-21 DIAGNOSIS — M0579 Rheumatoid arthritis with rheumatoid factor of multiple sites without organ or systems involvement: Secondary | ICD-10-CM | POA: Diagnosis not present

## 2019-11-26 DIAGNOSIS — Z23 Encounter for immunization: Secondary | ICD-10-CM | POA: Diagnosis not present

## 2019-12-05 DIAGNOSIS — Z79899 Other long term (current) drug therapy: Secondary | ICD-10-CM | POA: Diagnosis not present

## 2019-12-05 DIAGNOSIS — M0579 Rheumatoid arthritis with rheumatoid factor of multiple sites without organ or systems involvement: Secondary | ICD-10-CM | POA: Diagnosis not present

## 2019-12-05 DIAGNOSIS — I10 Essential (primary) hypertension: Secondary | ICD-10-CM | POA: Diagnosis not present

## 2019-12-05 DIAGNOSIS — M79643 Pain in unspecified hand: Secondary | ICD-10-CM | POA: Diagnosis not present

## 2019-12-05 DIAGNOSIS — E039 Hypothyroidism, unspecified: Secondary | ICD-10-CM | POA: Diagnosis not present

## 2019-12-05 DIAGNOSIS — I38 Endocarditis, valve unspecified: Secondary | ICD-10-CM | POA: Diagnosis not present

## 2019-12-05 DIAGNOSIS — M810 Age-related osteoporosis without current pathological fracture: Secondary | ICD-10-CM | POA: Diagnosis not present

## 2019-12-05 DIAGNOSIS — T50905A Adverse effect of unspecified drugs, medicaments and biological substances, initial encounter: Secondary | ICD-10-CM | POA: Diagnosis not present

## 2020-01-15 DIAGNOSIS — Z79899 Other long term (current) drug therapy: Secondary | ICD-10-CM | POA: Diagnosis not present

## 2020-01-15 DIAGNOSIS — E039 Hypothyroidism, unspecified: Secondary | ICD-10-CM | POA: Diagnosis not present

## 2020-01-15 DIAGNOSIS — M79643 Pain in unspecified hand: Secondary | ICD-10-CM | POA: Diagnosis not present

## 2020-01-15 DIAGNOSIS — I38 Endocarditis, valve unspecified: Secondary | ICD-10-CM | POA: Diagnosis not present

## 2020-01-15 DIAGNOSIS — M810 Age-related osteoporosis without current pathological fracture: Secondary | ICD-10-CM | POA: Diagnosis not present

## 2020-01-15 DIAGNOSIS — M0579 Rheumatoid arthritis with rheumatoid factor of multiple sites without organ or systems involvement: Secondary | ICD-10-CM | POA: Diagnosis not present

## 2020-01-15 DIAGNOSIS — I10 Essential (primary) hypertension: Secondary | ICD-10-CM | POA: Diagnosis not present

## 2020-02-20 DIAGNOSIS — Z1231 Encounter for screening mammogram for malignant neoplasm of breast: Secondary | ICD-10-CM | POA: Diagnosis not present

## 2020-03-12 DIAGNOSIS — I10 Essential (primary) hypertension: Secondary | ICD-10-CM | POA: Diagnosis not present

## 2020-03-12 DIAGNOSIS — R7301 Impaired fasting glucose: Secondary | ICD-10-CM | POA: Diagnosis not present

## 2020-03-19 DIAGNOSIS — I38 Endocarditis, valve unspecified: Secondary | ICD-10-CM | POA: Diagnosis not present

## 2020-03-19 DIAGNOSIS — I10 Essential (primary) hypertension: Secondary | ICD-10-CM | POA: Diagnosis not present

## 2020-03-19 DIAGNOSIS — R159 Full incontinence of feces: Secondary | ICD-10-CM | POA: Diagnosis not present

## 2020-03-19 DIAGNOSIS — M7989 Other specified soft tissue disorders: Secondary | ICD-10-CM | POA: Diagnosis not present

## 2020-03-19 DIAGNOSIS — E7800 Pure hypercholesterolemia, unspecified: Secondary | ICD-10-CM | POA: Diagnosis not present

## 2020-03-19 DIAGNOSIS — M0579 Rheumatoid arthritis with rheumatoid factor of multiple sites without organ or systems involvement: Secondary | ICD-10-CM | POA: Diagnosis not present

## 2020-03-19 DIAGNOSIS — R635 Abnormal weight gain: Secondary | ICD-10-CM | POA: Diagnosis not present

## 2020-05-05 DIAGNOSIS — E663 Overweight: Secondary | ICD-10-CM | POA: Diagnosis not present

## 2020-05-05 DIAGNOSIS — Z111 Encounter for screening for respiratory tuberculosis: Secondary | ICD-10-CM | POA: Diagnosis not present

## 2020-05-05 DIAGNOSIS — R5383 Other fatigue: Secondary | ICD-10-CM | POA: Diagnosis not present

## 2020-05-05 DIAGNOSIS — M0579 Rheumatoid arthritis with rheumatoid factor of multiple sites without organ or systems involvement: Secondary | ICD-10-CM | POA: Diagnosis not present

## 2020-05-05 DIAGNOSIS — M150 Primary generalized (osteo)arthritis: Secondary | ICD-10-CM | POA: Diagnosis not present

## 2020-05-05 DIAGNOSIS — Z6825 Body mass index (BMI) 25.0-25.9, adult: Secondary | ICD-10-CM | POA: Diagnosis not present

## 2020-08-06 DIAGNOSIS — R350 Frequency of micturition: Secondary | ICD-10-CM | POA: Diagnosis not present

## 2020-08-06 DIAGNOSIS — Z13220 Encounter for screening for lipoid disorders: Secondary | ICD-10-CM | POA: Diagnosis not present

## 2020-08-06 DIAGNOSIS — M0579 Rheumatoid arthritis with rheumatoid factor of multiple sites without organ or systems involvement: Secondary | ICD-10-CM | POA: Diagnosis not present

## 2020-08-06 DIAGNOSIS — M150 Primary generalized (osteo)arthritis: Secondary | ICD-10-CM | POA: Diagnosis not present

## 2020-08-06 DIAGNOSIS — E663 Overweight: Secondary | ICD-10-CM | POA: Diagnosis not present

## 2020-08-06 DIAGNOSIS — M7989 Other specified soft tissue disorders: Secondary | ICD-10-CM | POA: Diagnosis not present

## 2020-08-06 DIAGNOSIS — Z6826 Body mass index (BMI) 26.0-26.9, adult: Secondary | ICD-10-CM | POA: Diagnosis not present

## 2020-08-25 DIAGNOSIS — R300 Dysuria: Secondary | ICD-10-CM | POA: Diagnosis not present

## 2020-09-03 DIAGNOSIS — R635 Abnormal weight gain: Secondary | ICD-10-CM | POA: Diagnosis not present

## 2020-09-03 DIAGNOSIS — I10 Essential (primary) hypertension: Secondary | ICD-10-CM | POA: Diagnosis not present

## 2020-09-03 DIAGNOSIS — E7800 Pure hypercholesterolemia, unspecified: Secondary | ICD-10-CM | POA: Diagnosis not present

## 2020-09-03 DIAGNOSIS — I340 Nonrheumatic mitral (valve) insufficiency: Secondary | ICD-10-CM | POA: Diagnosis not present

## 2020-09-03 DIAGNOSIS — Z0000 Encounter for general adult medical examination without abnormal findings: Secondary | ICD-10-CM | POA: Diagnosis not present

## 2020-09-03 DIAGNOSIS — M810 Age-related osteoporosis without current pathological fracture: Secondary | ICD-10-CM | POA: Diagnosis not present

## 2020-09-03 DIAGNOSIS — I351 Nonrheumatic aortic (valve) insufficiency: Secondary | ICD-10-CM | POA: Diagnosis not present

## 2020-09-03 DIAGNOSIS — M0579 Rheumatoid arthritis with rheumatoid factor of multiple sites without organ or systems involvement: Secondary | ICD-10-CM | POA: Diagnosis not present

## 2020-09-03 DIAGNOSIS — K219 Gastro-esophageal reflux disease without esophagitis: Secondary | ICD-10-CM | POA: Diagnosis not present

## 2020-10-16 DIAGNOSIS — Z23 Encounter for immunization: Secondary | ICD-10-CM | POA: Diagnosis not present

## 2020-11-17 ENCOUNTER — Other Ambulatory Visit (HOSPITAL_COMMUNITY): Payer: Self-pay | Admitting: Internal Medicine

## 2020-11-17 DIAGNOSIS — M79605 Pain in left leg: Secondary | ICD-10-CM

## 2020-11-17 DIAGNOSIS — M7989 Other specified soft tissue disorders: Secondary | ICD-10-CM

## 2020-11-17 DIAGNOSIS — R600 Localized edema: Secondary | ICD-10-CM | POA: Diagnosis not present

## 2020-11-17 DIAGNOSIS — Z13220 Encounter for screening for lipoid disorders: Secondary | ICD-10-CM | POA: Diagnosis not present

## 2020-11-17 DIAGNOSIS — M150 Primary generalized (osteo)arthritis: Secondary | ICD-10-CM | POA: Diagnosis not present

## 2020-11-17 DIAGNOSIS — Z6825 Body mass index (BMI) 25.0-25.9, adult: Secondary | ICD-10-CM | POA: Diagnosis not present

## 2020-11-17 DIAGNOSIS — M0579 Rheumatoid arthritis with rheumatoid factor of multiple sites without organ or systems involvement: Secondary | ICD-10-CM | POA: Diagnosis not present

## 2020-11-17 DIAGNOSIS — E663 Overweight: Secondary | ICD-10-CM | POA: Diagnosis not present

## 2020-11-18 ENCOUNTER — Other Ambulatory Visit: Payer: Self-pay

## 2020-11-18 ENCOUNTER — Ambulatory Visit (HOSPITAL_COMMUNITY)
Admission: RE | Admit: 2020-11-18 | Discharge: 2020-11-18 | Disposition: A | Discharge status: Home / Self Care | Payer: Medicare Other | Source: Ambulatory Visit | Attending: Internal Medicine | Admitting: Internal Medicine

## 2020-11-18 ENCOUNTER — Other Ambulatory Visit (HOSPITAL_COMMUNITY): Payer: Self-pay | Admitting: Internal Medicine

## 2020-11-18 DIAGNOSIS — M7989 Other specified soft tissue disorders: Secondary | ICD-10-CM | POA: Insufficient documentation

## 2020-11-18 DIAGNOSIS — M79662 Pain in left lower leg: Secondary | ICD-10-CM

## 2020-11-18 DIAGNOSIS — M79605 Pain in left leg: Secondary | ICD-10-CM | POA: Insufficient documentation

## 2020-11-18 NOTE — Progress Notes (Signed)
Left lower extremity venous duplex completed. Refer to "CV Proc" under chart review to view preliminary results.  11/18/2020 2:32 PM Eula Fried., MHA, RVT, RDCS, RDMS

## 2020-11-22 DIAGNOSIS — S86109A Unspecified injury of other muscle(s) and tendon(s) of posterior muscle group at lower leg level, unspecified leg, initial encounter: Secondary | ICD-10-CM | POA: Diagnosis not present

## 2020-12-09 DIAGNOSIS — M79662 Pain in left lower leg: Secondary | ICD-10-CM | POA: Diagnosis not present

## 2020-12-09 DIAGNOSIS — I10 Essential (primary) hypertension: Secondary | ICD-10-CM | POA: Diagnosis not present

## 2020-12-09 DIAGNOSIS — I38 Endocarditis, valve unspecified: Secondary | ICD-10-CM | POA: Diagnosis not present

## 2020-12-09 DIAGNOSIS — M0579 Rheumatoid arthritis with rheumatoid factor of multiple sites without organ or systems involvement: Secondary | ICD-10-CM | POA: Diagnosis not present

## 2020-12-09 DIAGNOSIS — K219 Gastro-esophageal reflux disease without esophagitis: Secondary | ICD-10-CM | POA: Diagnosis not present

## 2020-12-22 DIAGNOSIS — M79662 Pain in left lower leg: Secondary | ICD-10-CM | POA: Diagnosis not present

## 2021-01-06 DIAGNOSIS — M79662 Pain in left lower leg: Secondary | ICD-10-CM | POA: Diagnosis not present

## 2021-01-12 DIAGNOSIS — R2242 Localized swelling, mass and lump, left lower limb: Secondary | ICD-10-CM | POA: Diagnosis not present

## 2021-01-12 DIAGNOSIS — M6281 Muscle weakness (generalized): Secondary | ICD-10-CM | POA: Diagnosis not present

## 2021-01-12 DIAGNOSIS — M25662 Stiffness of left knee, not elsewhere classified: Secondary | ICD-10-CM | POA: Diagnosis not present

## 2021-01-20 DIAGNOSIS — M79662 Pain in left lower leg: Secondary | ICD-10-CM | POA: Diagnosis not present

## 2021-02-22 DIAGNOSIS — M545 Low back pain, unspecified: Secondary | ICD-10-CM | POA: Diagnosis not present

## 2021-02-22 DIAGNOSIS — M25552 Pain in left hip: Secondary | ICD-10-CM | POA: Diagnosis not present

## 2021-03-04 DIAGNOSIS — Z1231 Encounter for screening mammogram for malignant neoplasm of breast: Secondary | ICD-10-CM | POA: Diagnosis not present

## 2021-03-16 DIAGNOSIS — E663 Overweight: Secondary | ICD-10-CM | POA: Diagnosis not present

## 2021-03-16 DIAGNOSIS — R600 Localized edema: Secondary | ICD-10-CM | POA: Diagnosis not present

## 2021-03-16 DIAGNOSIS — M0579 Rheumatoid arthritis with rheumatoid factor of multiple sites without organ or systems involvement: Secondary | ICD-10-CM | POA: Diagnosis not present

## 2021-03-16 DIAGNOSIS — Z6827 Body mass index (BMI) 27.0-27.9, adult: Secondary | ICD-10-CM | POA: Diagnosis not present

## 2021-03-16 DIAGNOSIS — Z13220 Encounter for screening for lipoid disorders: Secondary | ICD-10-CM | POA: Diagnosis not present

## 2021-03-16 DIAGNOSIS — M150 Primary generalized (osteo)arthritis: Secondary | ICD-10-CM | POA: Diagnosis not present

## 2021-04-21 DIAGNOSIS — M79601 Pain in right arm: Secondary | ICD-10-CM | POA: Diagnosis not present

## 2021-06-15 DIAGNOSIS — M0579 Rheumatoid arthritis with rheumatoid factor of multiple sites without organ or systems involvement: Secondary | ICD-10-CM | POA: Diagnosis not present

## 2021-06-17 DIAGNOSIS — M0579 Rheumatoid arthritis with rheumatoid factor of multiple sites without organ or systems involvement: Secondary | ICD-10-CM | POA: Diagnosis not present

## 2021-06-17 DIAGNOSIS — Z79899 Other long term (current) drug therapy: Secondary | ICD-10-CM | POA: Diagnosis not present

## 2021-09-14 DIAGNOSIS — R600 Localized edema: Secondary | ICD-10-CM | POA: Diagnosis not present

## 2021-09-14 DIAGNOSIS — E663 Overweight: Secondary | ICD-10-CM | POA: Diagnosis not present

## 2021-09-14 DIAGNOSIS — M1991 Primary osteoarthritis, unspecified site: Secondary | ICD-10-CM | POA: Diagnosis not present

## 2021-09-14 DIAGNOSIS — Z13220 Encounter for screening for lipoid disorders: Secondary | ICD-10-CM | POA: Diagnosis not present

## 2021-09-14 DIAGNOSIS — Z6826 Body mass index (BMI) 26.0-26.9, adult: Secondary | ICD-10-CM | POA: Diagnosis not present

## 2021-09-14 DIAGNOSIS — M0579 Rheumatoid arthritis with rheumatoid factor of multiple sites without organ or systems involvement: Secondary | ICD-10-CM | POA: Diagnosis not present

## 2021-10-13 ENCOUNTER — Inpatient Hospital Stay (HOSPITAL_COMMUNITY)
Admission: EM | Admit: 2021-10-13 | Discharge: 2021-10-16 | DRG: 177 | Disposition: A | Discharge status: Home / Self Care | Payer: Medicare Other | Attending: Internal Medicine | Admitting: Internal Medicine

## 2021-10-13 ENCOUNTER — Other Ambulatory Visit: Payer: Self-pay

## 2021-10-13 ENCOUNTER — Emergency Department (HOSPITAL_COMMUNITY): Payer: Medicare Other

## 2021-10-13 DIAGNOSIS — R7989 Other specified abnormal findings of blood chemistry: Secondary | ICD-10-CM | POA: Diagnosis present

## 2021-10-13 DIAGNOSIS — U071 COVID-19: Secondary | ICD-10-CM | POA: Diagnosis not present

## 2021-10-13 DIAGNOSIS — Z96642 Presence of left artificial hip joint: Secondary | ICD-10-CM | POA: Diagnosis present

## 2021-10-13 DIAGNOSIS — N1831 Chronic kidney disease, stage 3a: Secondary | ICD-10-CM | POA: Diagnosis present

## 2021-10-13 DIAGNOSIS — Z79899 Other long term (current) drug therapy: Secondary | ICD-10-CM | POA: Diagnosis not present

## 2021-10-13 DIAGNOSIS — M069 Rheumatoid arthritis, unspecified: Secondary | ICD-10-CM | POA: Diagnosis not present

## 2021-10-13 DIAGNOSIS — Z66 Do not resuscitate: Secondary | ICD-10-CM | POA: Diagnosis present

## 2021-10-13 DIAGNOSIS — I129 Hypertensive chronic kidney disease with stage 1 through stage 4 chronic kidney disease, or unspecified chronic kidney disease: Secondary | ICD-10-CM | POA: Diagnosis present

## 2021-10-13 DIAGNOSIS — Z79631 Long term (current) use of antimetabolite agent: Secondary | ICD-10-CM | POA: Diagnosis not present

## 2021-10-13 DIAGNOSIS — Z87891 Personal history of nicotine dependence: Secondary | ICD-10-CM | POA: Diagnosis not present

## 2021-10-13 DIAGNOSIS — R7982 Elevated C-reactive protein (CRP): Secondary | ICD-10-CM | POA: Diagnosis present

## 2021-10-13 DIAGNOSIS — R509 Fever, unspecified: Secondary | ICD-10-CM | POA: Diagnosis not present

## 2021-10-13 DIAGNOSIS — E860 Dehydration: Secondary | ICD-10-CM | POA: Diagnosis present

## 2021-10-13 DIAGNOSIS — R4182 Altered mental status, unspecified: Secondary | ICD-10-CM | POA: Diagnosis present

## 2021-10-13 DIAGNOSIS — R410 Disorientation, unspecified: Secondary | ICD-10-CM | POA: Diagnosis not present

## 2021-10-13 DIAGNOSIS — N3000 Acute cystitis without hematuria: Secondary | ICD-10-CM | POA: Diagnosis not present

## 2021-10-13 DIAGNOSIS — Z96653 Presence of artificial knee joint, bilateral: Secondary | ICD-10-CM | POA: Diagnosis present

## 2021-10-13 DIAGNOSIS — G9341 Metabolic encephalopathy: Secondary | ICD-10-CM | POA: Diagnosis not present

## 2021-10-13 DIAGNOSIS — Z96612 Presence of left artificial shoulder joint: Secondary | ICD-10-CM | POA: Diagnosis present

## 2021-10-13 DIAGNOSIS — Z7952 Long term (current) use of systemic steroids: Secondary | ICD-10-CM

## 2021-10-13 DIAGNOSIS — N179 Acute kidney failure, unspecified: Secondary | ICD-10-CM | POA: Diagnosis not present

## 2021-10-13 DIAGNOSIS — I10 Essential (primary) hypertension: Secondary | ICD-10-CM | POA: Diagnosis not present

## 2021-10-13 DIAGNOSIS — J189 Pneumonia, unspecified organism: Secondary | ICD-10-CM | POA: Diagnosis not present

## 2021-10-13 DIAGNOSIS — N390 Urinary tract infection, site not specified: Secondary | ICD-10-CM

## 2021-10-13 DIAGNOSIS — R531 Weakness: Secondary | ICD-10-CM | POA: Diagnosis present

## 2021-10-13 LAB — CBC WITH DIFFERENTIAL/PLATELET
Abs Immature Granulocytes: 0.02 10*3/uL (ref 0.00–0.07)
Basophils Absolute: 0 10*3/uL (ref 0.0–0.1)
Basophils Relative: 0 %
Eosinophils Absolute: 0 10*3/uL (ref 0.0–0.5)
Eosinophils Relative: 0 %
HCT: 34.2 % — ABNORMAL LOW (ref 36.0–46.0)
Hemoglobin: 11.3 g/dL — ABNORMAL LOW (ref 12.0–15.0)
Immature Granulocytes: 0 %
Lymphocytes Relative: 11 %
Lymphs Abs: 0.8 10*3/uL (ref 0.7–4.0)
MCH: 30.7 pg (ref 26.0–34.0)
MCHC: 33 g/dL (ref 30.0–36.0)
MCV: 92.9 fL (ref 80.0–100.0)
Monocytes Absolute: 0.8 10*3/uL (ref 0.1–1.0)
Monocytes Relative: 12 %
Neutro Abs: 5.3 10*3/uL (ref 1.7–7.7)
Neutrophils Relative %: 77 %
Platelets: 221 10*3/uL (ref 150–400)
RBC: 3.68 MIL/uL — ABNORMAL LOW (ref 3.87–5.11)
RDW: 14.4 % (ref 11.5–15.5)
WBC: 6.9 10*3/uL (ref 4.0–10.5)
nRBC: 0 % (ref 0.0–0.2)

## 2021-10-13 LAB — COMPREHENSIVE METABOLIC PANEL
ALT: 17 U/L (ref 0–44)
AST: 30 U/L (ref 15–41)
Albumin: 3.8 g/dL (ref 3.5–5.0)
Alkaline Phosphatase: 50 U/L (ref 38–126)
Anion gap: 11 (ref 5–15)
BUN: 22 mg/dL (ref 8–23)
CO2: 21 mmol/L — ABNORMAL LOW (ref 22–32)
Calcium: 9.2 mg/dL (ref 8.9–10.3)
Chloride: 106 mmol/L (ref 98–111)
Creatinine, Ser: 1.25 mg/dL — ABNORMAL HIGH (ref 0.44–1.00)
GFR, Estimated: 43 mL/min — ABNORMAL LOW (ref >60)
Glucose, Bld: 100 mg/dL — ABNORMAL HIGH (ref 70–99)
Potassium: 3.8 mmol/L (ref 3.5–5.1)
Sodium: 138 mmol/L (ref 135–145)
Total Bilirubin: 0.6 mg/dL (ref 0.3–1.2)
Total Protein: 7.5 g/dL (ref 6.5–8.1)

## 2021-10-13 MED ORDER — IBUPROFEN 200 MG PO TABS: 600.0000 mg | ORAL_TABLET | Freq: Once | ORAL | Status: DC

## 2021-10-13 MED ORDER — ACETAMINOPHEN 500 MG PO TABS: 1000.0000 mg | ORAL_TABLET | Freq: Once | ORAL | Status: DC

## 2021-10-13 NOTE — ED Triage Notes (Signed)
PT seen at Urgent Care today, febrile at 100.5.  Pt was found sitting in her car with it running on Monday and didn't know how she got there.  Pt was found to have UTI while at urgent care and also to be covid positive.  Sent for further eval.  Pt has no complaints other than "slight cough".

## 2021-10-14 ENCOUNTER — Encounter (HOSPITAL_COMMUNITY): Payer: Self-pay | Admitting: Family Medicine

## 2021-10-14 DIAGNOSIS — R7989 Other specified abnormal findings of blood chemistry: Secondary | ICD-10-CM | POA: Diagnosis present

## 2021-10-14 DIAGNOSIS — Z96653 Presence of artificial knee joint, bilateral: Secondary | ICD-10-CM | POA: Diagnosis present

## 2021-10-14 DIAGNOSIS — N390 Urinary tract infection, site not specified: Secondary | ICD-10-CM

## 2021-10-14 DIAGNOSIS — R410 Disorientation, unspecified: Secondary | ICD-10-CM | POA: Diagnosis not present

## 2021-10-14 DIAGNOSIS — I129 Hypertensive chronic kidney disease with stage 1 through stage 4 chronic kidney disease, or unspecified chronic kidney disease: Secondary | ICD-10-CM | POA: Diagnosis present

## 2021-10-14 DIAGNOSIS — Z87891 Personal history of nicotine dependence: Secondary | ICD-10-CM | POA: Diagnosis not present

## 2021-10-14 DIAGNOSIS — R7982 Elevated C-reactive protein (CRP): Secondary | ICD-10-CM | POA: Diagnosis present

## 2021-10-14 DIAGNOSIS — I10 Essential (primary) hypertension: Secondary | ICD-10-CM | POA: Diagnosis not present

## 2021-10-14 DIAGNOSIS — N179 Acute kidney failure, unspecified: Secondary | ICD-10-CM

## 2021-10-14 DIAGNOSIS — M069 Rheumatoid arthritis, unspecified: Secondary | ICD-10-CM | POA: Diagnosis present

## 2021-10-14 DIAGNOSIS — Z79631 Long term (current) use of antimetabolite agent: Secondary | ICD-10-CM | POA: Diagnosis not present

## 2021-10-14 DIAGNOSIS — U071 COVID-19: Secondary | ICD-10-CM | POA: Diagnosis present

## 2021-10-14 DIAGNOSIS — N1831 Chronic kidney disease, stage 3a: Secondary | ICD-10-CM | POA: Diagnosis present

## 2021-10-14 DIAGNOSIS — Z79899 Other long term (current) drug therapy: Secondary | ICD-10-CM | POA: Diagnosis not present

## 2021-10-14 DIAGNOSIS — Z7952 Long term (current) use of systemic steroids: Secondary | ICD-10-CM | POA: Diagnosis not present

## 2021-10-14 DIAGNOSIS — R531 Weakness: Secondary | ICD-10-CM | POA: Diagnosis present

## 2021-10-14 DIAGNOSIS — Z66 Do not resuscitate: Secondary | ICD-10-CM | POA: Diagnosis present

## 2021-10-14 DIAGNOSIS — N3000 Acute cystitis without hematuria: Secondary | ICD-10-CM | POA: Diagnosis present

## 2021-10-14 DIAGNOSIS — E860 Dehydration: Secondary | ICD-10-CM | POA: Diagnosis present

## 2021-10-14 DIAGNOSIS — Z96642 Presence of left artificial hip joint: Secondary | ICD-10-CM | POA: Diagnosis present

## 2021-10-14 DIAGNOSIS — G9341 Metabolic encephalopathy: Secondary | ICD-10-CM | POA: Diagnosis present

## 2021-10-14 DIAGNOSIS — Z96612 Presence of left artificial shoulder joint: Secondary | ICD-10-CM | POA: Diagnosis present

## 2021-10-14 LAB — BASIC METABOLIC PANEL
Anion gap: 9 (ref 5–15)
BUN: 20 mg/dL (ref 8–23)
CO2: 23 mmol/L (ref 22–32)
Calcium: 9.2 mg/dL (ref 8.9–10.3)
Chloride: 106 mmol/L (ref 98–111)
Creatinine, Ser: 1.13 mg/dL — ABNORMAL HIGH (ref 0.44–1.00)
GFR, Estimated: 49 mL/min — ABNORMAL LOW (ref >60)
Glucose, Bld: 111 mg/dL — ABNORMAL HIGH (ref 70–99)
Potassium: 3.7 mmol/L (ref 3.5–5.1)
Sodium: 138 mmol/L (ref 135–145)

## 2021-10-14 LAB — URINALYSIS, ROUTINE W REFLEX MICROSCOPIC
Bilirubin Urine: NEGATIVE
Glucose, UA: NEGATIVE mg/dL
Ketones, ur: 5 mg/dL — AB
Nitrite: NEGATIVE
Protein, ur: 30 mg/dL — AB
Specific Gravity, Urine: 1.014 (ref 1.005–1.030)
pH: 5 (ref 5.0–8.0)

## 2021-10-14 LAB — C-REACTIVE PROTEIN: CRP: 1.7 mg/dL — ABNORMAL HIGH (ref <1.0)

## 2021-10-14 LAB — SARS CORONAVIRUS 2 BY RT PCR: SARS Coronavirus 2 by RT PCR: POSITIVE — AB

## 2021-10-14 LAB — PROCALCITONIN: Procalcitonin: <0.1 ng/mL

## 2021-10-14 LAB — D-DIMER, QUANTITATIVE: D-Dimer, Quant: 0.88 ug/mL-FEU — ABNORMAL HIGH (ref 0.00–0.50)

## 2021-10-14 LAB — TSH: TSH: 1.879 u[IU]/mL (ref 0.350–4.500)

## 2021-10-14 LAB — SEDIMENTATION RATE: Sed Rate: 57 mm/hr — ABNORMAL HIGH (ref 0–22)

## 2021-10-14 MED ORDER — ADULT MULTIVITAMIN W/MINERALS CH
1.0000 | ORAL_TABLET | Freq: Every day | ORAL | Status: DC
Start: 2021-10-14 — End: 2021-10-16
  Administered 2021-10-14 – 2021-10-16 (×3): 1 via ORAL
  Filled 2021-10-14 (×3): qty 1

## 2021-10-14 MED ORDER — BISOPROLOL FUMARATE 5 MG PO TABS
5.0000 mg | ORAL_TABLET | Freq: Every day | ORAL | Status: DC
  Administered 2021-10-14 – 2021-10-16 (×3): 5 mg via ORAL
  Filled 2021-10-14 (×3): qty 1

## 2021-10-14 MED ORDER — UPADACITINIB ER 15 MG PO TB24: 15.0000 mg | ORAL_TABLET | Freq: Every day | ORAL | Status: DC

## 2021-10-14 MED ORDER — SODIUM CHLORIDE 0.9 % IV SOLN: INTRAVENOUS | Status: AC

## 2021-10-14 MED ORDER — ENOXAPARIN SODIUM 40 MG/0.4ML IJ SOSY
40.0000 mg | PREFILLED_SYRINGE | INTRAMUSCULAR | Status: DC
  Administered 2021-10-14 – 2021-10-16 (×3): 40 mg via SUBCUTANEOUS
  Filled 2021-10-14 (×3): qty 0.4

## 2021-10-14 MED ORDER — SODIUM CHLORIDE 0.9 % IV SOLN
1.0000 g | INTRAVENOUS | Status: DC
  Administered 2021-10-14 – 2021-10-16 (×3): 1 g via INTRAVENOUS
  Filled 2021-10-14 (×3): qty 10

## 2021-10-14 MED ORDER — ORAL CARE MOUTH RINSE: 15.0000 mL | OROMUCOSAL | Status: DC | PRN

## 2021-10-14 MED ORDER — FAMOTIDINE 20 MG PO TABS: 20.0000 mg | ORAL_TABLET | Freq: Every evening | ORAL | Status: DC | PRN

## 2021-10-14 MED ORDER — ENSURE ENLIVE PO LIQD
237.0000 mL | Freq: Two times a day (BID) | ORAL | Status: DC
  Administered 2021-10-14 – 2021-10-16 (×3): 237 mL via ORAL

## 2021-10-14 MED ORDER — LACTATED RINGERS IV BOLUS
500.0000 mL | Freq: Once | INTRAVENOUS | Status: AC
  Administered 2021-10-14: 500 mL via INTRAVENOUS

## 2021-10-14 MED ORDER — MOLNUPIRAVIR EUA 200MG CAPSULE
4.0000 | ORAL_CAPSULE | Freq: Two times a day (BID) | ORAL | Status: DC
  Administered 2021-10-14 – 2021-10-16 (×6): 800 mg via ORAL
  Filled 2021-10-14: qty 4

## 2021-10-14 MED ORDER — ACETAMINOPHEN 325 MG PO TABS
650.0000 mg | ORAL_TABLET | Freq: Four times a day (QID) | ORAL | Status: DC | PRN
  Administered 2021-10-14 (×2): 650 mg via ORAL
  Filled 2021-10-14 (×2): qty 2

## 2021-10-14 MED ORDER — OYSTER SHELL CALCIUM/D3 500-5 MG-MCG PO TABS
1.0000 | ORAL_TABLET | Freq: Every day | ORAL | Status: DC
  Administered 2021-10-15 – 2021-10-16 (×2): 1 via ORAL
  Filled 2021-10-14 (×2): qty 1

## 2021-10-14 MED ORDER — HYDRALAZINE HCL 20 MG/ML IJ SOLN: 10.0000 mg | Freq: Four times a day (QID) | INTRAMUSCULAR | Status: DC | PRN

## 2021-10-14 NOTE — Assessment & Plan Note (Signed)
Likely secondary to COVID.  Had positive antigen COVID test in urgent care.  PCR is pending here.

## 2021-10-14 NOTE — Assessment & Plan Note (Signed)
UA at urgent care was positive for leukocyte, negative for nitrite.  We will follow repeat here with urine culture. -Start empiric IV Rocephin for now

## 2021-10-14 NOTE — Assessment & Plan Note (Addendum)
Continue antihypertensives pending med rec

## 2021-10-14 NOTE — Assessment & Plan Note (Signed)
Had positive COVID antigen test in urgent care.  PCR still pending here. -Will start Molnupiravir since she is high risk

## 2021-10-14 NOTE — Progress Notes (Signed)
PROGRESS NOTE    Sandra Torres  BBC:[id number] DOB: [birth year] DOA: 10/13/2021 PCP: Janie Morning, DO   Brief Narrative:  HPI: Sandra Torres is a 81 y.o. female with medical history significant of HTN, rheumatoid arthritis who presents from urgent care for positive COVID and UTI.   Patient reports for the past 3 days she has had increasing weakness, productive cough and has felt more disoriented.  She presented to urgent care today and was brought in paperwork showing she had a fever of 100.5 F with positive COVID antigen test.  UA showed positive leukocyte, negative nitrates, positive blood.  She was advised to present to the ED for concern of sepsis.  Denies any nausea, vomiting or diarrhea.  No dysuria, increased urgency or frequency.   In the ED, she was febrile up to 100.5, normotensive on room air.  No leukocytosis, hemoglobin of 11.5.  Creatinine of 1.25 with GFR 43.  No prior creatinine for comparison.   CT head is negative Chest x-ray is negative  Assessment & Plan:   Principal Problem:   AMS (altered mental status) Active Problems:   Hypertension   COVID-19 virus infection   AKI (acute kidney injury) ([place])   Rheumatoid arthritis (HCC)   UTI (urinary tract infection)  AMS (altered mental status): Likely secondary to infection.  Currently fully alert and oriented.   UTI (urinary tract infection) UA at urgent care was positive for leukocyte, negative for nitrite.  I am not fully convinced that this is real UTI but unsure if her fever was coming from [place] or UTI so we will continue 3 doses of antibiotics/Rocephin.  Last fever of 100.5 at 9:30 PM on 9/23.   Rheumatoid arthritis ([place]) Pt unable to verify if she is on immunosuppressive medication. Will await med rec.    AKI (acute kidney injury) ([place]) Creatinine is elevated 1.25 which has improved a little bit.  I will provide her with 500 cc fluid again and recheck labs in the morning.   COVID-19 virus infection: Appears  very comfortable.  Chest x-ray clear.  Will check CRP, ESR, D-dimer and procalcitonin.  Continue Molnupiravir.  Patient was encouraged to prone, out of bed to chair, to use incentive spirometry and flutter valve.   Hypertension, essential: She is not taking any medications PTA, currently slightly hypertensive, will start on as needed hydralazine.  DVT prophylaxis: enoxaparin (LOVENOX) injection 40 mg Start: 10/14/21 1000   Code Status: DNR  Family Communication:  None present at bedside.  Plan of care discussed with patient in length and he/she verbalized understanding and agreed with it.  Status is: Observation The patient will require care spanning > 2 midnights and should be moved to inpatient because: Needs to be afebrile for 24 hours before discharge.   Estimated body mass index is 25.73 kg/m as calculated from the following:   Height as of this encounter: 5' 4"  (1.626 m).   Weight as of this encounter: 68 kg.    Nutritional Assessment: Body mass index is 25.73 kg/m.Marland Kitchen Seen by dietician.  I agree with the assessment and plan as outlined below: Nutrition Status:        . Skin Assessment: I have examined the patient's skin and I agree with the wound assessment as performed by the wound care RN as outlined below:    Consultants:  None  Procedures:  None  Antimicrobials:  Anti-infectives (From admission, onward)    Start     Dose/Rate Route Frequency Ordered Stop  10/14/21 0100  molnupiravir EUA (LAGEVRIO) capsule 800 mg        4 capsule Oral 2 times daily 10/14/21 0054 10/18/21 2159   10/14/21 0100  cefTRIAXone (ROCEPHIN) 1 g in sodium chloride 0.9 % 100 mL IVPB        1 g 200 mL/hr over 30 Minutes Intravenous Every 24 hours 10/14/21 0055           Subjective: Seen and examined.  She feels well.  She has no complaints other than cough.  Objective: Vitals:   10/14/21 0115 10/14/21 0148 10/14/21 0215 10/14/21 0544  BP: 110/78 (!) 146/96  (!) 140/88  Pulse:  88 82  85  Resp: 17 17  17   Temp: 98 F (36.7 C) 98.9 F (37.2 C)  99.1 F (37.3 C)  TempSrc: Oral Oral  Oral  SpO2: 99% 100%  100%  Weight:   68 kg   Height:   5' 4"  (1.626 m)     Intake/Output Summary (Last 24 hours) at 10/14/2021 0933 Last data filed at 10/14/2021 0442 Gross per 24 hour  Intake 818.37 ml  Output 300 ml  Net 518.37 ml   Filed Weights   10/14/21 0215  Weight: 68 kg    Examination:  General exam: Appears calm and comfortable  Respiratory system: Clear to auscultation. Respiratory effort normal. Cardiovascular system: S1 & S2 heard, RRR. No JVD, murmurs, rubs, gallops or clicks. No pedal edema. Gastrointestinal system: Abdomen is nondistended, soft and nontender. No organomegaly or masses felt. Normal bowel sounds heard. Central nervous system: Alert and oriented. No focal neurological deficits. Extremities: Symmetric 5 x 5 power. Skin: No rashes, lesions or ulcers Psychiatry: Judgement and insight appear normal. Mood & affect appropriate.    Data Reviewed: I have personally reviewed following labs and imaging studies  CBC: Recent Labs  Lab 10/13/21 2145  WBC 6.9  NEUTROABS 5.3  HGB 11.3*  HCT 34.2*  MCV 92.9  PLT 010   Basic Metabolic Panel: Recent Labs  Lab 10/13/21 2145 10/14/21 0451  NA 138 138  K 3.8 3.7  CL 106 106  CO2 21* 23  GLUCOSE 100* 111*  BUN 22 20  CREATININE 1.25* 1.13*  CALCIUM 9.2 9.2   GFR: Estimated Creatinine Clearance: 36.4 mL/min (A) (by C-G formula based on SCr of 1.13 mg/dL (H)). Liver Function Tests: Recent Labs  Lab 10/13/21 2145  AST 30  ALT 17  ALKPHOS 50  BILITOT 0.6  PROT 7.5  ALBUMIN 3.8   No results for input(s): "LIPASE", "AMYLASE" in the last 168 hours. No results for input(s): "AMMONIA" in the last 168 hours. Coagulation Profile: No results for input(s): "INR", "PROTIME" in the last 168 hours. Cardiac Enzymes: No results for input(s): "CKTOTAL", "CKMB", "CKMBINDEX", "TROPONINI" in the last  168 hours. BNP (last 3 results) No results for input(s): "PROBNP" in the last 8760 hours. HbA1C: No results for input(s): "HGBA1C" in the last 72 hours. CBG: No results for input(s): "GLUCAP" in the last 168 hours. Lipid Profile: No results for input(s): "CHOL", "HDL", "LDLCALC", "TRIG", "CHOLHDL", "LDLDIRECT" in the last 72 hours. Thyroid Function Tests: Recent Labs    10/14/21 0451  TSH 1.879   Anemia Panel: No results for input(s): "VITAMINB12", "FOLATE", "FERRITIN", "TIBC", "IRON", "RETICCTPCT" in the last 72 hours. Sepsis Labs: No results for input(s): "PROCALCITON", "LATICACIDVEN" in the last 168 hours.  Recent Results (from the past 240 hour(s))  SARS Coronavirus 2 by RT PCR (hospital order, performed in [facility] hospital  lab) *cepheid single result test* Anterior Nasal Swab     Status: Abnormal   Collection Time: 10/13/21 11:31 PM   Specimen: Anterior Nasal Swab  Result Value Ref Range Status   SARS Coronavirus 2 by RT PCR POSITIVE (A) NEGATIVE Final    Comment: (NOTE) SARS-CoV-2 target nucleic acids are DETECTED  SARS-CoV-2 RNA is generally detectable in upper respiratory specimens  during the acute phase of infection.  Positive results are indicative  of the presence of the identified virus, but do not rule out bacterial infection or co-infection with other pathogens not detected by the test.  Clinical correlation with patient history and  other diagnostic information is necessary to determine patient infection status.  The expected result is negative.  Fact Sheet for Patients:   https://www.patel.info/   Fact Sheet for Healthcare Providers:   https://hall.com/    This test is not yet approved or cleared by the [place] FDA and  has been authorized for detection and/or diagnosis of SARS-CoV-2 by FDA under an Emergency Use Authorization (EUA).  This EUA will remain in effect (meaning this test can be used) for  the duration of  the COVID-19 declaration under Section 564(b)(1)  of the Act, 21 U.S.C. section 360-bbb-3(b)(1), unless the authorization is terminated or revoked sooner.   Performed at [facility], [place] [address] [place], [place] [address]      Radiology Studies: [facility] Chest Port 1 View  Result Date: 10/13/2021 CLINICAL DATA:  Fever, pneumonia EXAM: PORTABLE CHEST 1 VIEW COMPARISON:  02/28/2013 FINDINGS: In lungs are clear. No pneumothorax or pleural effusion. Cardiac size is mildly enlarged, stable when accounting for semi upright positioning. Pulmonary vascularity is normal. 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 No acute bone abnormality. IMPRESSION: No active disease. Electronically Signed   By: Fidela Salisbury M.D.   On: 10/13/2021 23:18   CT Head Wo Contrast  Result Date: 10/13/2021 CLINICAL DATA:  Delirium. EXAM: CT HEAD WITHOUT CONTRAST TECHNIQUE: Contiguous axial images were obtained from the base of the skull through the vertex without intravenous contrast. RADIATION DOSE REDUCTION: This exam was performed according to the departmental dose-optimization program which includes automated exposure control, adjustment of the mA and/or kV according to patient size and/or use of iterative reconstruction technique. COMPARISON:  Head CT dated 02/28/2013. FINDINGS: Brain: There is mild age-related atrophy and chronic microvascular ischemic changes. There is no acute intracranial hemorrhage. No mass effect or midline shift. No extra-axial fluid collection. Vascular: No hyperdense vessel or unexpected calcification. Skull: Normal. Negative for fracture or focal lesion. Sinuses/Orbits: The visualized paranasal sinuses and mastoid air cells are clear. Old left lamina papyracea fracture. Other: None IMPRESSION: 1. No acute intracranial pathology. 2. Mild age-related atrophy and chronic microvascular ischemic changes. Electronically Signed   By: Anner Crete M.D.   On:  10/13/2021 22:11    Scheduled Meds:  enoxaparin (LOVENOX) injection  40 mg Subcutaneous Q24H   feeding supplement  237 mL Oral BID BM   ibuprofen  600 mg Oral Once   molnupiravir EUA  4 capsule Oral BID   Continuous Infusions:  sodium chloride     cefTRIAXone (ROCEPHIN)  IV 1 g (10/14/21 0308)     LOS: 0 days   Darliss Cheney, MD [facility]  10/14/2021, 9:33 AM   *Please note that this is a verbal dictation therefore any spelling or grammatical errors are due to the "[place]" system interpretation.  Please page via [place] and do not message  via secure chat for urgent patient care matters. Secure chat can be used for non urgent patient care matters.  How to contact the [facility] Attending or Consulting provider [place] or covering provider during after hours [place], for this patient?  Check the care team in [facility] and look for a) attending/consulting [facility] provider listed and b) the [facility] team listed. Page or secure chat [phone]. Log into [email or web address] and use [place]'s universal password to access. If you do not have the password, please contact the hospital operator. Locate the [facility] provider you are looking for under [facility] and page to a number that you can be directly reached. If you still have difficulty reaching the provider, please page the [facility] (Director on Call) for the Hospitalists listed on amion for assistance.

## 2021-10-14 NOTE — ED Provider Notes (Signed)
[place] 4TH FLOOR PROGRESSIVE CARE AND UROLOGY Provider Note  CSN: [id number] Arrival date & time: 10/13/21 2104  Chief Complaint(s) Covid Positive and UTI  HPI Sandra Torres is a 81 y.o. female with PMH rheumatoid arthritis, HTN who presents emergency department from urgent care due to concern for altered mental status in the setting of suspected COVID-19 and urinary tract infection.  History obtained from patient's family who states that 2 days ago, the patient was found asleep in her car confused and slow to respond.  Her family states that they have been trying to get her to come to the hospital since that event and finally convinced her to go to urgent care today where she was found to have a urinary tract infection and tested positive for COVID-19.  She was then transferred to the emergency department due to persistent altered mental status.  Here in the ER, patient is slow to respond to questions but ultimately will get to the right answers.  Family states that this is very abnormal behavior for her.  Additional history cannot be obtained due to patient's altered mental status.   Past Medical History Past Medical History:  Diagnosis Date   Hypertension    Rheumatoid arthritis [facility])    Patient Active Problem List   Diagnosis Date Noted   COVID-19 virus infection 10/14/2021   AKI (acute kidney injury) (HCC) 10/14/2021   Rheumatoid arthritis (HCC) 10/14/2021   UTI (urinary tract infection) 10/14/2021   AMS (altered mental status) 10/13/2021   Hypomagnesemia 03/01/2013   Influenza with other respiratory manifestations 03/01/2013   Febrile illness, acute 02/28/2013   Hypertension 02/28/2013   Fall 02/28/2013   Dehydration 02/28/2013   Acute febrile illness 02/28/2013   Abnormal CXR 02/28/2013   Fever of unknown origin 02/28/2013   Home Medication(s) Prior to Admission medications   Medication Sig Start Date End Date Taking? Authorizing Provider  bisoprolol (ZEBETA) 5 MG  tablet Take 5 mg by mouth daily.    [provider]  Cholecalciferol (VITAMIN D PO) Take 2,000 Units by mouth daily.     [provider]  clotrimazole (LOTRIMIN) 1 % cream Apply to affected area 2 times daily 05/20/19   Cristina Gong, PA-C  folic acid (FOLVITE) 1 MG tablet Take 1 mg by mouth daily.    [provider]  HYDROcodone-acetaminophen (NORCO) 10-325 MG per tablet Take 1 tablet by mouth every 6 (six) hours as needed for moderate pain. As directed.    [provider]  methotrexate (RHEUMATREX) 2.5 MG tablet Take 17.5 mg by mouth once a week. 04/09/15   [provider]  Multiple Vitamin (MULTIVITAMIN WITH MINERALS) TABS tablet Take 1 tablet by mouth daily.    [provider]  omega-3 acid ethyl esters (LOVAZA) 1 G capsule Take 1 g by mouth daily.    [provider]  predniSONE (DELTASONE) 1 MG tablet Take 4 mg by mouth daily.    [provider]  Past Surgical History Past Surgical History:  Procedure Laterality Date   JOINT REPLACEMENT     TOTAL HIP ARTHROPLASTY Left    TOTAL KNEE ARTHROPLASTY Bilateral    TOTAL SHOULDER REPLACEMENT Left    Family History Family History  Problem Relation Age of Onset   Hypertension Neg Hx    Heart disease Neg Hx    Stroke Neg Hx    Cancer Neg Hx     Social History Social History   Tobacco Use   Smoking status: Former  Substance Use Topics   Alcohol use: No   Drug use: No   Allergies Patient has no known allergies.  Review of Systems Review of Systems  Unable to perform ROS: Mental status change    Physical Exam Vital Signs  I have reviewed the triage vital signs BP (!) 140/88 (BP Location: Left Arm)   Pulse 85   Temp 99.1 F (37.3 C) (Oral)   Resp 17   Ht 5\' 4"  (1.626 m)   Wt 68 kg   SpO2 100%   BMI 25.73 kg/m    Physical Exam Vitals and nursing note reviewed.  Constitutional:      General: She is not in acute distress.    Appearance: She is well-developed.  HENT:     Head: Normocephalic and atraumatic.  Eyes:     Conjunctiva/sclera: Conjunctivae normal.  Cardiovascular:     Rate and Rhythm: Normal rate and regular rhythm.     Heart sounds: No murmur heard. Pulmonary:     Effort: Pulmonary effort is normal. No respiratory distress.     Breath sounds: Normal breath sounds.  Abdominal:     Palpations: Abdomen is soft.     Tenderness: There is no abdominal tenderness.  Musculoskeletal:        General: No swelling.     Cervical back: Neck supple.  Skin:    General: Skin is warm and dry.     Capillary Refill: Capillary refill takes less than 2 seconds.  Neurological:     Mental Status: She is alert.  Psychiatric:        Mood and Affect: Mood normal.     ED Results and Treatments Labs (all labs ordered are listed, but only abnormal results are displayed) Labs Reviewed  SARS CORONAVIRUS 2 BY RT PCR - Abnormal; Notable for the following components:      Result Value   SARS Coronavirus 2 by RT PCR POSITIVE (*)    All other components within normal limits  COMPREHENSIVE METABOLIC PANEL - Abnormal; Notable for the following components:   CO2 21 (*)    Glucose, Bld 100 (*)    Creatinine, Ser 1.25 (*)    GFR, Estimated 43 (*)    All other components within normal limits  CBC WITH DIFFERENTIAL/PLATELET - Abnormal; Notable for the following components:   RBC 3.68 (*)    Hemoglobin 11.3 (*)    HCT 34.2 (*)    All other components within normal limits  BASIC METABOLIC PANEL - Abnormal; Notable for the following components:   Glucose, Bld 111 (*)    Creatinine, Ser 1.13 (*)    GFR, Estimated 49 (*)    All other components within normal limits  URINALYSIS, ROUTINE W REFLEX MICROSCOPIC - Abnormal; Notable for the following components:   APPearance HAZY (*)    Hgb urine dipstick LARGE  (*)    Ketones, ur 5 (*)    Protein, ur 30 (*)    Leukocytes,Ua  LARGE (*)    Bacteria, UA RARE (*)    All other components within normal limits  SEDIMENTATION RATE - Abnormal; Notable for the following components:   Sed Rate 57 (*)    All other components within normal limits  D-DIMER, QUANTITATIVE - Abnormal; Notable for the following components:   D-Dimer, Quant 0.88 (*)    All other components within normal limits  URINE CULTURE  TSH  PROCALCITONIN  C-REACTIVE PROTEIN                                                                                                                          Radiology DG Chest Port 1 View  Result Date: 10/13/2021 CLINICAL DATA:  Fever, pneumonia EXAM: PORTABLE CHEST 1 VIEW COMPARISON:  02/28/2013 FINDINGS: In lungs are clear. No pneumothorax or pleural effusion. Cardiac size is mildly enlarged, stable when accounting for semi upright positioning. Pulmonary vascularity is normal. 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 No acute bone abnormality. IMPRESSION: No active disease. Electronically Signed   By: Helyn Numbers M.D.   On: 10/13/2021 23:18   CT Head Wo Contrast  Result Date: 10/13/2021 CLINICAL DATA:  Delirium. EXAM: CT HEAD WITHOUT CONTRAST TECHNIQUE: Contiguous axial images were obtained from the base of the skull through the vertex without intravenous contrast. RADIATION DOSE REDUCTION: This exam was performed according to the departmental dose-optimization program which includes automated exposure control, adjustment of the mA and/or kV according to patient size and/or use of iterative reconstruction technique. COMPARISON:  Head CT dated 02/28/2013. FINDINGS: Brain: There is mild age-related atrophy and chronic microvascular ischemic changes. There is no acute intracranial hemorrhage. No mass effect or midline shift. No extra-axial fluid collection. Vascular: No hyperdense vessel or unexpected calcification. Skull: Normal. Negative for  fracture or focal lesion. Sinuses/Orbits: The visualized paranasal sinuses and mastoid air cells are clear. Old left lamina papyracea fracture. Other: None IMPRESSION: 1. No acute intracranial pathology. 2. Mild age-related atrophy and chronic microvascular ischemic changes. Electronically Signed   By: Elgie Collard M.D.   On: 10/13/2021 22:11    Pertinent labs & imaging results that were available during my care of the patient were reviewed by me and considered in my medical decision making (see MDM for details).  Medications Ordered in ED Medications  ibuprofen (ADVIL) tablet 600 mg (0 mg Oral Hold 10/13/21 2333)  enoxaparin (LOVENOX) injection 40 mg (40 mg Subcutaneous Given 10/14/21 0947)  molnupiravir EUA (LAGEVRIO) capsule 800 mg (800 mg Oral Given 10/14/21 0947)  acetaminophen (TYLENOL) tablet 650 mg (has no administration in time range)  cefTRIAXone (ROCEPHIN) 1 g in sodium chloride 0.9 % 100 mL IVPB (1 g Intravenous New Bag/Given 10/14/21 0308)  feeding supplement (ENSURE ENLIVE / ENSURE PLUS) liquid 237 mL (237 mLs Oral Given 10/14/21 0947)  Oral care mouth rinse (has no administration in time range)  0.9 %  sodium chloride infusion ( Intravenous Not Given 10/14/21 0928)  hydrALAZINE (APRESOLINE) injection 10 mg (has  no administration in time range)  lactated ringers bolus 500 mL (500 mLs Intravenous New Bag/Given 10/14/21 0210)                                                                                                                                     Procedures Procedures  (including critical care time)  Medical Decision Making / ED Course   This patient presents to the ED for concern of altered mental status, this involves an extensive number of treatment options, and is a complaint that carries with it a high risk of complications and morbidity.  The differential diagnosis includes COVID-19, UTI, electrolyte abnormality, intracranial bleed  MDM: Patient seen emergency room for  evaluation of altered mental status.  Physical exam largely unremarkable.  Laboratory evaluation with a mild creatinine elevation to 1.25 but is otherwise unremarkable.  Chest x-ray and CT head unremarkable.  Patient pending confirmatory UA at time of signout.  Anticipate admission regardless due to persistent altered mental status.  Please see provider signout note for continuation of work-up.   Additional history obtained: -Additional history obtained from multiple family members -External records from outside source obtained and reviewed including: Chart review including previous notes, labs, imaging, consultation notes   Lab Tests: -I ordered, reviewed, and interpreted labs.   The pertinent results include:   Labs Reviewed  SARS CORONAVIRUS 2 BY RT PCR - Abnormal; Notable for the following components:      Result Value   SARS Coronavirus 2 by RT PCR POSITIVE (*)    All other components within normal limits  COMPREHENSIVE METABOLIC PANEL - Abnormal; Notable for the following components:   CO2 21 (*)    Glucose, Bld 100 (*)    Creatinine, Ser 1.25 (*)    GFR, Estimated 43 (*)    All other components within normal limits  CBC WITH DIFFERENTIAL/PLATELET - Abnormal; Notable for the following components:   RBC 3.68 (*)    Hemoglobin 11.3 (*)    HCT 34.2 (*)    All other components within normal limits  BASIC METABOLIC PANEL - Abnormal; Notable for the following components:   Glucose, Bld 111 (*)    Creatinine, Ser 1.13 (*)    GFR, Estimated 49 (*)    All other components within normal limits  URINALYSIS, ROUTINE W REFLEX MICROSCOPIC - Abnormal; Notable for the following components:   APPearance HAZY (*)    Hgb urine dipstick LARGE (*)    Ketones, ur 5 (*)    Protein, ur 30 (*)    Leukocytes,Ua LARGE (*)    Bacteria, UA RARE (*)    All other components within normal limits  SEDIMENTATION RATE - Abnormal; Notable for the following components:   Sed Rate 57 (*)    All other  components within normal limits  D-DIMER, QUANTITATIVE - Abnormal; Notable for the following components:   D-Dimer, Quant 0.88 (*)  All other components within normal limits  URINE CULTURE  TSH  PROCALCITONIN  C-REACTIVE PROTEIN     Imaging Studies ordered: I ordered imaging studies including chest x-ray, CT head I independently visualized and interpreted imaging. I agree with the radiologist interpretation   Medicines ordered and prescription drug management: Meds ordered this encounter  Medications   DISCONTD: acetaminophen (TYLENOL) tablet 1,000 mg   ibuprofen (ADVIL) tablet 600 mg   enoxaparin (LOVENOX) injection 40 mg   molnupiravir EUA (LAGEVRIO) capsule 800 mg   lactated ringers bolus 500 mL   acetaminophen (TYLENOL) tablet 650 mg   cefTRIAXone (ROCEPHIN) 1 g in sodium chloride 0.9 % 100 mL IVPB    Order Specific Question:   Antibiotic Indication:    Answer:   UTI   feeding supplement (ENSURE ENLIVE / ENSURE PLUS) liquid 237 mL   Oral care mouth rinse   0.9 %  sodium chloride infusion   hydrALAZINE (APRESOLINE) injection 10 mg    -I have reviewed the patients home medicines and have made adjustments as needed  Critical interventions none   Cardiac Monitoring: The patient was maintained on a cardiac monitor.  I personally viewed and interpreted the cardiac monitored which showed an underlying rhythm of: NSR  Social Determinants of Health:  Factors impacting patients care include: nond   Reevaluation: After the interventions noted above, I reevaluated the patient and found that they have :stayed the same  Co morbidities that complicate the patient evaluation  Past Medical History:  Diagnosis Date   Hypertension    Rheumatoid arthritis (HCC)       Dispostion: I considered admission for this patient, and disposition is currently pending confirmatory urinalysis.  Anticipate admission regardless.     Final Clinical Impression(s) / ED Diagnoses Final  diagnoses:  COVID-19  Acute cystitis without hematuria     @PCDICTATION @    , MD 10/14/21 1140

## 2021-10-14 NOTE — H&P (Signed)
History and Physical    Patient: Sandra Torres:[id number] DOB: Jan 09, 1941 DOA: 10/13/2021 DOS: the patient was seen and examined on 10/14/2021 PCP: Irena Reichmann, DO  Patient coming from: Home  Chief Complaint:  Chief Complaint  Patient presents with   Covid Positive   UTI   HPI: Sandra Torres is a 81 y.o. female with medical history significant of HTN, rheumatoid arthritis who presents from urgent care for positive COVID and UTI.  Patient reports for the past 3 days she has had increasing weakness, productive cough and has felt more disoriented.  She presented to urgent care today and was brought in paperwork showing she had a fever of 100.5 F with positive COVID antigen test.  UA showed positive leukocyte, negative nitrates, positive blood.  She was advised to present to the ED for concern of sepsis.  Denies any nausea, vomiting or diarrhea.  No dysuria, increased urgency or frequency.  In the ED, she was febrile up to 100.5, normotensive on room air.  No leukocytosis, hemoglobin of 11.5.  Creatinine of 1.25 with GFR 43.  No prior creatinine for comparison.  CT head is negative Chest x-ray is negative  COVID PCR is pending.  Repeat UA is also pending. Review of Systems: As mentioned in the history of present illness. All other systems reviewed and are negative. Past Medical History:  Diagnosis Date   Hypertension    Rheumatoid arthritis (HCC)    Past Surgical History:  Procedure Laterality Date   JOINT REPLACEMENT     TOTAL HIP ARTHROPLASTY Left    TOTAL KNEE ARTHROPLASTY Bilateral    TOTAL SHOULDER REPLACEMENT Left    Social History:  reports that she has quit smoking. She does not have any smokeless tobacco history on file. She reports that she does not drink alcohol and does not use drugs.  No Known Allergies  Family History  Problem Relation Age of Onset   Hypertension Neg Hx    Heart disease Neg Hx    Stroke Neg Hx    Cancer Neg Hx     Prior to Admission  medications   Medication Sig Start Date End Date Taking? Authorizing Provider  bisoprolol (ZEBETA) 5 MG tablet Take 5 mg by mouth daily.    [provider]  Cholecalciferol (VITAMIN D PO) Take 2,000 Units by mouth daily.     [provider]  clotrimazole (LOTRIMIN) 1 % cream Apply to affected area 2 times daily 05/20/19   Cristina Gong, PA-C  folic acid (FOLVITE) 1 MG tablet Take 1 mg by mouth daily.    [provider]  HYDROcodone-acetaminophen (NORCO) 10-325 MG per tablet Take 1 tablet by mouth every 6 (six) hours as needed for moderate pain. As directed.    [provider]  methotrexate (RHEUMATREX) 2.5 MG tablet Take 17.5 mg by mouth once a week. 04/09/15   [provider]  Multiple Vitamin (MULTIVITAMIN WITH MINERALS) TABS tablet Take 1 tablet by mouth daily.    [provider]  omega-3 acid ethyl esters (LOVAZA) 1 G capsule Take 1 g by mouth daily.    [provider]  predniSONE (DELTASONE) 1 MG tablet Take 4 mg by mouth daily.    [provider]    Physical Exam: Vitals:   10/13/21 2120 10/13/21 2126 10/13/21 2145 10/13/21 2303  BP: (!) 135/93  114/88   Pulse: 99  90   Resp:  18 18   Temp:  (!) 100.5 F (38.1 C)  97.8 F (36.6 C)  TempSrc:  Oral  Oral  SpO2: 100%  99%    Constitutional: NAD, calm, comfortable, nontoxic well-appearing elderly female laying flat in bed Eyes: lids and conjunctivae normal ENMT: Mucous membranes are moist.  Neck: normal, supple Respiratory: clear to auscultation bilaterally, no wheezing, no crackles. Normal respiratory effort. No accessory muscle use.  Occasional productive cough Cardiovascular: Regular rate and rhythm, no murmurs / rubs / gallops.  Trace nonpitting edema bilateral feet.   Abdomen: Soft, nontender nondistended.  Bowel sounds positive.  Musculoskeletal: no clubbing / cyanosis. No joint deformity upper and lower extremities. Good ROM, no contractures. Normal  muscle tone.  Skin: no rashes, lesions, ulcers. No induration Neurologic: CN 2-12 grossly intact.Strength 5/5 in all 4.  Psychiatric: Normal judgment and insight. Alert and oriented x 3. Normal mood. Data Reviewed:  See HPI  Assessment and Plan: * AMS (altered mental status) Likely secondary to COVID.  Had positive antigen COVID test in urgent care.  PCR is pending here.  UTI (urinary tract infection) UA at urgent care was positive for leukocyte, negative for nitrite.  We will follow repeat here with urine culture. -Start empiric IV Rocephin for now  Rheumatoid arthritis (HCC) Pt unable to verify if she is on immunosuppressive medication. Will await med rec.   AKI (acute kidney injury) (HCC) Creatinine is elevated 1.25 without prior for comparison although suspect she could be mildly dehydrated with her illness.  Give 500 cc LR bolus and follow repeat in the morning.  COVID-19 virus infection Had positive COVID antigen test in urgent care.  PCR still pending here. -Will start Molnupiravir since she is high risk  Hypertension Continue antihypertensives pending med rec      Advance Care Planning:   Code Status: DNR   Consults: None  Family Communication: Discussed with grandson at bedside  Severity of Illness: The appropriate patient status for this patient is OBSERVATION. Observation status is judged to be reasonable and necessary in order to provide the required intensity of service to ensure the patient's safety. The patient's presenting symptoms, physical exam findings, and initial radiographic and laboratory data in the context of their medical condition is felt to place them at decreased risk for further clinical deterioration. Furthermore, it is anticipated that the patient will be medically stable for discharge from the hospital within 2 midnights of admission.   Author: Anselm Jungling, DO 10/14/2021 1:04 AM  For on call review [email or web address].

## 2021-10-14 NOTE — Assessment & Plan Note (Signed)
Pt unable to verify if she is on immunosuppressive medication. Will await med rec.

## 2021-10-14 NOTE — Assessment & Plan Note (Signed)
Creatinine is elevated 1.25 without prior for comparison although suspect she could be mildly dehydrated with her illness.  Give 500 cc LR bolus and follow repeat in the morning.

## 2021-10-15 LAB — BASIC METABOLIC PANEL
Anion gap: 8 (ref 5–15)
BUN: 15 mg/dL (ref 8–23)
CO2: 24 mmol/L (ref 22–32)
Calcium: 8.9 mg/dL (ref 8.9–10.3)
Chloride: 104 mmol/L (ref 98–111)
Creatinine, Ser: 0.93 mg/dL (ref 0.44–1.00)
GFR, Estimated: >60 mL/min (ref >60)
Glucose, Bld: 117 mg/dL — ABNORMAL HIGH (ref 70–99)
Potassium: 3.5 mmol/L (ref 3.5–5.1)
Sodium: 136 mmol/L (ref 135–145)

## 2021-10-15 LAB — URINE CULTURE

## 2021-10-15 NOTE — TOC Initial Note (Addendum)
Transition of Care [facility]) - Initial/Assessment Note    Patient Details  Name: Sandra Torres MRN: [id number] Date of Birth: [birth year]  [facility] CM/SW Contact:    Lavenia Atlas, RN Phone Number: 10/15/2021, 1:53 PM  Clinical Narrative:       Patient does not have any current TOC needs. Uses [facility] [employer]. Family will transport at discharge.  TOC will continue to follow for needs.            Expected Discharge Plan: Home/Self Care Barriers to Discharge: Continued Medical Work up   Patient Goals and CMS Choice Patient states their goals for this hospitalization and ongoing recovery are:: return home   Choice offered to / list presented to : NA  Expected Discharge Plan and Services Expected Discharge Plan: Home/Self Care In-house Referral: NA Discharge Planning Services: CM Consult   Living arrangements for the past 2 months: Single Family Home                 DME Arranged: N/A DME Agency: NA         HH Agency: NA        Prior Living Arrangements/Services Living arrangements for the past 2 months: Single Family Home Lives with:: Adult Children Patient language and need for interpreter reviewed:: Yes Do you feel safe going back to the place where you live?: Yes      Need for Family Participation in Patient Care: No (Comment) Care giver support system in place?: Yes (comment) Current home services: DME (cane, walker) Criminal Activity/Legal Involvement Pertinent to Current Situation/Hospitalization: No - Comment as needed  Activities of Daily Living Home Assistive Devices/Equipment: Cane (specify quad or straight), Dentures (specify type) (upper and lower dentures) ADL Screening (condition at time of admission) Patient's cognitive ability adequate to safely complete daily activities?: Yes Is the patient deaf or have difficulty hearing?: No Does the patient have difficulty seeing, even when wearing glasses/contacts?: No Does  the patient have difficulty concentrating, remembering, or making decisions?: No Patient able to express need for assistance with ADLs?: Yes Does the patient have difficulty dressing or bathing?: Yes Independently performs ADLs?: Yes (appropriate for developmental age) Does the patient have difficulty walking or climbing stairs?: Yes Weakness of Legs: None Weakness of Arms/Hands: None  Permission Sought/Granted Permission sought to share information with : Case Manager Permission granted to share information with : Yes, Verbal Permission Granted  Share Information with NAME: Case Manager           Emotional Assessment Appearance:: Appears stated age Attitude/Demeanor/Rapport: Gracious Affect (typically observed): Accepting Orientation: : Oriented to Self, Oriented to Place, Oriented to  Time Alcohol / Substance Use: Not Applicable Psych Involvement: No (comment)  Admission diagnosis:  Acute cystitis without hematuria [N30.00] AMS (altered mental status) [R41.82] COVID-19 [U07.1] Patient Active Problem List   Diagnosis Date Noted   COVID-19 virus infection 10/14/2021   AKI (acute kidney injury) (HCC) 10/14/2021   Rheumatoid arthritis (HCC) 10/14/2021   UTI (urinary tract infection) 10/14/2021   AMS (altered mental status) 10/13/2021   Hypomagnesemia 03/01/2013   Influenza with other respiratory manifestations 03/01/2013   Febrile illness, acute 02/28/2013   Hypertension 02/28/2013   Fall 02/28/2013   Dehydration 02/28/2013   Acute febrile illness 02/28/2013   Abnormal CXR 02/28/2013   Fever of unknown origin 02/28/2013   PCP:  Irena Reichmann, DO Pharmacy:   [id number] - [place], [place] - [address] AT  [facility] OF [employer] & [address] [place] [address] Phone: [phone] Fax: [phone]  [facility] [phone] [place], [place] - [address] AT [facility] OF [address] & [address] [address] Phone: [phone] Fax: [phone]     Social Determinants of Health (SDOH) Interventions    Readmission Risk Interventions     No data to display

## 2021-10-15 NOTE — Evaluation (Signed)
Occupational Therapy Evaluation Patient Details Name: Sandra Torres MRN: [id number] DOB: [birth year] Today's Date: 10/15/2021   History of Present Illness Sandra Torres is a 81 y.o. female with medical history significant of HTN, rheumatoid arthritis who presents from urgent care for positive COVID and UTI.   Clinical Impression   Sandra Torres is an 81 year old woman who presents with above medical history. On evaluation she demonstrates the ability to transfer, ambulate in room and perform ADLs without physical assistance. Other than feeling slightly weak patient reports feeling like her baseline. She will have assistance of her son pRN at discharge. No OT needs at this time. Educated patient how to use IS while in room.  Vital signs normal.      Recommendations for follow up therapy are one component of a multi-disciplinary discharge planning process, led by the attending physician.  Recommendations may be updated based on patient status, additional functional criteria and insurance authorization.   Follow Up Recommendations  No OT follow up    Assistance Recommended at Discharge Intermittent Supervision/Assistance  Patient can return home with the following Assistance with cooking/housework;Help with stairs or ramp for entrance    Functional Status Assessment  Patient has not had a recent decline in their functional status  Equipment Recommendations  None recommended by OT    Recommendations for Other Services       Precautions / Restrictions Precautions Precautions: None Restrictions Weight Bearing Restrictions: No      Mobility Bed Mobility Overal bed mobility: Modified Independent                  Transfers Overall transfer level: Needs assistance Equipment used: None               General transfer comment: Supervision for safety but no physical assistance      Balance Overall balance assessment: Mild deficits observed, not formally tested                                          ADL either performed or assessed with clinical judgement   ADL Overall ADL's : Modified independent                                       General ADL Comments: increased time but no physical assistance.     Vision Baseline Vision/History: 1 Wears glasses Patient Visual Report: No change from baseline       Perception     Praxis      Pertinent Vitals/Pain Pain Assessment Pain Assessment: No/denies pain     Hand Dominance Right   Extremity/Trunk Assessment Upper Extremity Assessment Upper Extremity Assessment: Overall WFL for tasks assessed (arthritic changes in bilateral hands from RA)   Lower Extremity Assessment Lower Extremity Assessment: Defer to PT evaluation   Cervical / Trunk Assessment Cervical / Trunk Assessment: Normal   Communication Communication Communication: No difficulties   Cognition Arousal/Alertness: Awake/alert Behavior During Therapy: WFL for tasks assessed/performed Overall Cognitive Status: Within Functional Limits for tasks assessed                                       General Comments  Exercises     Shoulder Instructions      Home Living Family/patient expects to be discharged to:: Private residence Living Arrangements: Children (Son, who works) Available Help at Discharge: Family;Available PRN/intermittently Type of Home: House Home Access: Stairs to enter [employer] of Steps: 3   Home Layout: One level     Bathroom Shower/Tub: Producer, television/film/video: Standard     Home Equipment: Agricultural consultant (2 wheels);Cane - single point          Prior Functioning/Environment Prior Level of Function : Independent/Modified Independent             Mobility Comments: doesn't typically use a device, still drives ADLs Comments: independent - compensatory strategies as needed due to RA in hands        OT  Problem List: Decreased strength      OT Treatment/Interventions:      OT Goals(Current goals can be found in the care plan section) Acute Rehab OT Goals OT Goal Formulation: All assessment and education complete, DC therapy  OT Frequency:      Co-evaluation              AM-PAC OT "6 Clicks" Daily Activity     Outcome Measure Help from another person eating meals?: None Help from another person taking care of personal grooming?: None Help from another person toileting, which includes using toliet, bedpan, or urinal?: None Help from another person bathing (including washing, rinsing, drying)?: None Help from another person to put on and taking off regular upper body clothing?: None Help from another person to put on and taking off regular lower body clothing?: None 6 Click Score: 24   End of Session Nurse Communication:  (requesting a flu shot)  Activity Tolerance: Patient tolerated treatment well Patient left: in bed;with call bell/phone within reach;with bed alarm set  OT Visit Diagnosis: Muscle weakness (generalized) (M62.81)                Time: 8413-2440 OT Time Calculation (min): 19 min Charges:  OT General Charges $OT Visit: 1 Visit OT Evaluation $OT Eval Low Complexity: 1 Low  Donnella Sham, OTR/L Acute Care Rehab Services  Office [phone]   Kelli Churn 10/15/2021, 4:53 PM

## 2021-10-15 NOTE — Progress Notes (Signed)
PROGRESS NOTE    FREDDA CLARIDA  Torres:[id number] DOB: 12/12/1940 DOA: 10/13/2021 PCP: Janie Morning, DO   Brief Narrative:  HPI: Sandra Torres is a 81 y.o. female with medical history significant of HTN, rheumatoid arthritis who presents from urgent care for positive COVID and UTI.   Patient reports for the past 3 days she has had increasing weakness, productive cough and has felt more disoriented.  She presented to urgent care today and was brought in paperwork showing she had a fever of 100.5 F with positive COVID antigen test.  UA showed positive leukocyte, negative nitrates, positive blood.  She was advised to present to the ED for concern of sepsis.  Denies any nausea, vomiting or diarrhea.  No dysuria, increased urgency or frequency.   In the ED, she was febrile up to 100.5, normotensive on room air.  No leukocytosis, hemoglobin of 11.5.  Creatinine of 1.25 with GFR 43.  No prior creatinine for comparison.   CT head is negative Chest x-ray is negative  Assessment & Plan:   Principal Problem:   AMS (altered mental status) Active Problems:   Hypertension   COVID-19 virus infection   AKI (acute kidney injury) ([place])   Rheumatoid arthritis (HCC)   UTI (urinary tract infection)  AMS (altered mental status): Likely secondary to infection.  Currently fully alert and oriented.   UTI (urinary tract infection) UA at urgent care was positive for leukocyte, negative for nitrite.  I am not fully convinced that this is real UTI but unsure if her fever was coming from [name] or UTI so we will continue 3 doses of antibiotics/Rocephin.  Last fever of 100.7 around 6 PM on 9/7.   Rheumatoid arthritis ([place]) Pt unable to verify if she is on immunosuppressive medication. Will await med rec.    AKI (acute kidney injury) ([name]) Creatinine is elevated 1.25 .  Pending labs today.   COVID-19 virus infection: Appears very comfortable.  Chest x-ray clear.  Slightly elevated CRP ESR and D-dimer, D-dimer  normal for the age when corrected.  Continue Molnupiravir.  Patient was encouraged to prone, out of bed to chair, to use incentive spirometry and flutter valve.   Hypertension, essential: She is not taking any medications PTA, currently slightly hypertensive at times, will continue as needed hydralazine.  DVT prophylaxis: enoxaparin (LOVENOX) injection 40 mg Start: 10/14/21 1000   Code Status: DNR  Family Communication:  None present at bedside.  Plan of care discussed with patient in length and he/she verbalized understanding and agreed with it.  Status is: Inpatient Remains inpatient appropriate because: Still febrile.    Estimated body mass index is 25.73 kg/m as calculated from the following:   Height as of this encounter: _0  (1.626 m).   Weight as of this encounter: 68 kg.    Nutritional Assessment: Body mass index is 25.73 kg/m.Marland Kitchen Seen by dietician.  I agree with the assessment and plan as outlined below: Nutrition Status:        . Skin Assessment: I have examined the patient's skin and I agree with the wound assessment as performed by the wound care RN as outlined below:    Consultants:  None  Procedures:  None  Antimicrobials:  Anti-infectives (From admission, onward)    Start     Dose/Rate Route Frequency Ordered Stop   10/14/21 0100  molnupiravir EUA (LAGEVRIO) capsule 800 mg        4 capsule Oral 2 times daily 10/14/21 0054 10/18/21 2159  10/14/21 0100  cefTRIAXone (ROCEPHIN) 1 g in sodium chloride 0.9 % 100 mL IVPB        1 g 200 mL/hr over 30 Minutes Intravenous Every 24 hours 10/14/21 0055           Subjective:  Patient seen and examined.  She has no complaints.  She is frustrated that she has to stand overnight due to persistent fever.  But understands that this is in her best interest.  Objective: Vitals:   10/14/21 2000 10/14/21 2142 10/14/21 2144 10/15/21 0607  BP:   115/83 (!) 149/87  Pulse:  81 81 80  Resp: _0 Temp:   99.2 F (37.3 C) 99.2 F (37.3 C) 99.6 F (37.6 C)  TempSrc:  Oral  Oral  SpO2:  100% 100% 100%  Weight:      Height:        Intake/Output Summary (Last 24 hours) at 10/15/2021 1143 Last data filed at 10/15/2021 0700 Gross per 24 hour  Intake 350.44 ml  Output --  Net 350.44 ml    Filed Weights   10/14/21 0215  Weight: 68 kg    Examination:  General exam: Appears calm and comfortable  Respiratory system: Clear to auscultation. Respiratory effort normal. Cardiovascular system: S1 & S2 heard, RRR. No JVD, murmurs, rubs, gallops or clicks. No pedal edema. Gastrointestinal system: Abdomen is nondistended, soft and nontender. No organomegaly or masses felt. Normal bowel sounds heard. Central nervous system: Alert and oriented. No focal neurological deficits. Extremities: Symmetric 5 x 5 power. Skin: No rashes, lesions or ulcers.  Psychiatry: Judgement and insight appear normal. Mood & affect appropriate.    Data Reviewed: I have personally reviewed following labs and imaging studies  CBC: Recent Labs  Lab 10/13/21 2145  WBC 6.9  NEUTROABS 5.3  HGB 11.3*  HCT 34.2*  MCV 92.9  PLT 264    Basic Metabolic Panel: Recent Labs  Lab 10/13/21 2145 10/14/21 0451  NA 138 138  K 3.8 3.7  CL 106 106  CO2 21* 23  GLUCOSE 100* 111*  BUN 22 20  CREATININE 1.25* 1.13*  CALCIUM 9.2 9.2    GFR: Estimated Creatinine Clearance: 36.4 mL/min (A) (by C-G formula based on SCr of 1.13 mg/dL (H)). Liver Function Tests: Recent Labs  Lab 10/13/21 2145  AST 30  ALT 17  ALKPHOS 50  BILITOT 0.6  PROT 7.5  ALBUMIN 3.8    No results for input(s): "LIPASE", "AMYLASE" in the last 168 hours. No results for input(s): "AMMONIA" in the last 168 hours. Coagulation Profile: No results for input(s): "INR", "PROTIME" in the last 168 hours. Cardiac Enzymes: No results for input(s): "CKTOTAL", "CKMB", "CKMBINDEX", "TROPONINI" in the last 168 hours. BNP (last 3 results) No results for  input(s): "PROBNP" in the last 8760 hours. HbA1C: No results for input(s): "HGBA1C" in the last 72 hours. CBG: No results for input(s): "GLUCAP" in the last 168 hours. Lipid Profile: No results for input(s): "CHOL", "HDL", "LDLCALC", "TRIG", "CHOLHDL", "LDLDIRECT" in the last 72 hours. Thyroid Function Tests: Recent Labs    10/14/21 0451  TSH 1.879    Anemia Panel: No results for input(s): "VITAMINB12", "FOLATE", "FERRITIN", "TIBC", "IRON", "RETICCTPCT" in the last 72 hours. Sepsis Labs: Recent Labs  Lab 10/14/21 1005  PROCALCITON <0.10    Recent Results (from the past 240 hour(s))  SARS Coronavirus 2 by RT PCR (hospital order, performed in [facility] hospital lab) *cepheid single result test* Anterior Nasal Swab  Status: Abnormal   Collection Time: 10/13/21 11:31 PM   Specimen: Anterior Nasal Swab  Result Value Ref Range Status   SARS Coronavirus 2 by RT PCR POSITIVE (A) NEGATIVE Final    Comment: (NOTE) SARS-CoV-2 target nucleic acids are DETECTED  SARS-CoV-2 RNA is generally detectable in upper respiratory specimens  during the acute phase of infection.  Positive results are indicative  of the presence of the identified virus, but do not rule out bacterial infection or co-infection with other pathogens not detected by the test.  Clinical correlation with patient history and  other diagnostic information is necessary to determine patient infection status.  The expected result is negative.  Fact Sheet for Patients:   https://www.patel.info/   Fact Sheet for Healthcare Providers:   https://hall.com/    This test is not yet approved or cleared by the [place] FDA and  has been authorized for detection and/or diagnosis of SARS-CoV-2 by FDA under an Emergency Use Authorization (EUA).  This EUA will remain in effect (meaning this test can be used) for the duration of  the COVID-19 declaration under Section 564(b)(1)  of  the Act, 21 U.S.C. section 360-bbb-3(b)(1), unless the authorization is terminated or revoked sooner.   Performed at [facility], [place] [address] [place], [place] [address]   Urine Culture     Status: Abnormal   Collection Time: 10/14/21  2:42 AM   Specimen: Urine, Clean Catch  Result Value Ref Range Status   Specimen Description   Final    URINE, CLEAN CATCH Performed at [facility], [place] [address], [place], [place] [address]    Special Requests   Final    NONE Performed at [facility], [place] [address] [place], [place] [address]    Culture MULTIPLE SPECIES PRESENT, SUGGEST RECOLLECTION (A)  Final   Report Status 10/15/2021 FINAL  Final     Radiology Studies: DG Chest Port 1 View  Result Date: 10/13/2021 CLINICAL DATA:  Fever, pneumonia EXAM: PORTABLE CHEST 1 VIEW COMPARISON:  02/28/2013 FINDINGS: In lungs are clear. No pneumothorax or pleural effusion. Cardiac size is mildly enlarged, stable when accounting for semi upright positioning. Pulmonary vascularity is normal. 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 No acute bone abnormality. IMPRESSION: No active disease. Electronically Signed   By: Fidela Salisbury M.D.   On: 10/13/2021 23:18   CT Head Wo Contrast  Result Date: 10/13/2021 CLINICAL DATA:  Delirium. EXAM: CT HEAD WITHOUT CONTRAST TECHNIQUE: Contiguous axial images were obtained from the base of the skull through the vertex without intravenous contrast. RADIATION DOSE REDUCTION: This exam was performed according to the departmental dose-optimization program which includes automated exposure control, adjustment of the mA and/or kV according to patient size and/or use of iterative reconstruction technique. COMPARISON:  Head CT dated 02/28/2013. FINDINGS: Brain: There is mild age-related atrophy and chronic microvascular ischemic changes. There is no acute intracranial hemorrhage. No mass effect or midline  shift. No extra-axial fluid collection. Vascular: No hyperdense vessel or unexpected calcification. Skull: Normal. Negative for fracture or focal lesion. Sinuses/Orbits: The visualized paranasal sinuses and mastoid air cells are clear. Old left lamina papyracea fracture. Other: None IMPRESSION: 1. No acute intracranial pathology. 2. Mild age-related atrophy and chronic microvascular ischemic changes. Electronically Signed   By: Anner Crete M.D.   On: 10/13/2021 22:11    Scheduled Meds:  bisoprolol  5 mg Oral Daily   calcium-vitamin D  1 tablet Oral Q breakfast  enoxaparin (LOVENOX) injection  40 mg Subcutaneous Q24H   feeding supplement  237 mL Oral BID BM   ibuprofen  600 mg Oral Once   molnupiravir EUA  4 capsule Oral BID   multivitamin with minerals  1 tablet Oral Daily   Continuous Infusions:  cefTRIAXone (ROCEPHIN)  IV Stopped (10/15/21 0144)     LOS: 1 day   Darliss Cheney, MD [facility]  10/15/2021, 11:43 AM   *Please note that this is a verbal dictation therefore any spelling or grammatical errors are due to the "[place]" system interpretation.  Please page via [place] and do not message via secure chat for urgent patient care matters. Secure chat can be used for non urgent patient care matters.  How to contact the [facility] Attending or Consulting provider [place] or covering provider during after hours [place], for this patient?  Check the care team in [facility] and look for a) attending/consulting [facility] provider listed and b) the [facility] team listed. Page or secure chat [phone]. Log into [email or web address] and use [place]'s universal password to access. If you do not have the password, please contact the hospital operator. Locate the [facility] provider you are looking for under [facility] and page to a number that you can be directly reached. If you still have difficulty reaching the provider, please page the [facility] (Director on Call) for the Hospitalists listed on amion for  assistance.

## 2021-10-16 DIAGNOSIS — G9341 Metabolic encephalopathy: Secondary | ICD-10-CM

## 2021-10-16 MED ORDER — ACETAMINOPHEN 325 MG PO TABS: 650.0000 mg | ORAL_TABLET | Freq: Four times a day (QID) | ORAL | Status: AC | PRN

## 2021-10-16 MED ORDER — DEXTROMETHORPHAN POLISTIREX ER 30 MG/5ML PO SUER: 30.0000 mg | Freq: Two times a day (BID) | ORAL | 0 refills | Status: AC | PRN

## 2021-10-16 MED ORDER — RINVOQ 15 MG PO TB24: 15.0000 mg | ORAL_TABLET | Freq: Every day | ORAL | Status: AC

## 2021-10-16 MED ORDER — ASCORBIC ACID 500 MG PO TABS: 250.0000 mg | ORAL_TABLET | Freq: Two times a day (BID) | ORAL | 1 refills | Status: AC

## 2021-10-16 MED ORDER — ZINC 220 (50 ZN) MG PO CAPS: 220.0000 mg | ORAL_CAPSULE | Freq: Every day | ORAL | 1 refills | Status: AC

## 2021-10-16 MED ORDER — ALBUTEROL SULFATE HFA 108 (90 BASE) MCG/ACT IN AERS: 2.0000 | INHALATION_SPRAY | Freq: Three times a day (TID) | RESPIRATORY_TRACT | 1 refills | Status: AC | PRN

## 2021-10-16 MED ORDER — MOLNUPIRAVIR EUA 200MG CAPSULE: 4.0000 | ORAL_CAPSULE | Freq: Two times a day (BID) | ORAL | Status: AC

## 2021-10-16 NOTE — Discharge Summary (Signed)
Physician Discharge Summary   Patient: Sandra Torres MRN: [id number] DOB: January 24, 1941  Admit date:     10/13/2021  Discharge date: 10/16/21  Discharge Physician: Vassie Loll   PCP: Irena Reichmann, DO   Recommendations at discharge:  Repeat basic metabolic panel to follow Electrolytes and renal function Reassess blood pressure and adjust antihypertensive regimen as needed Repeat CBC to follow hemoglobin/platelet count stability Once fully recover from COVID infection restart the use of Rinvoq for her RA treatment.   Discharge Diagnoses: Principal Problem:   AMS (altered mental status) Active Problems:   Hypertension   COVID-19 virus infection   AKI (acute kidney injury) on chronic kidney disease stage III (HCC)   Rheumatoid arthritis (HCC)   UTI (urinary tract infection)   Acute metabolic encephalopathy  Brief Hospital admission Course: As per H&P written by Dr. Cyndia Bent on 10/14/2021  Sandra Torres is a 81 y.o. female with medical history significant of HTN, rheumatoid arthritis who presents from urgent care for positive COVID and UTI.   Patient reports for the past 3 days she has had increasing weakness, productive cough and has felt more disoriented.  She presented to urgent care today and was brought in paperwork showing she had a fever of 100.5 F with positive COVID antigen test.  UA showed positive leukocyte, negative nitrates, positive blood.  She was advised to present to the ED for concern of sepsis.  Denies any nausea, vomiting or diarrhea.  No dysuria, increased urgency or frequency.   In the ED, she was febrile up to 100.5, normotensive on room air.  No leukocytosis, hemoglobin of 11.5.  Creatinine of 1.25 with GFR 43.  No prior creatinine for comparison.   CT head is negative Chest x-ray is negative  Assessment and Plan: * AMS (altered mental status) -Acute metabolic encephalopathy secondary to COVID/UTI -Symptoms completely improved after stabilizing condition and no  further presence of fever.   -Patient treated with 3 days of Rocephin for UTI and no longer experiencing dysuria or frequency.   -Patient will complete treatment for COVID infection using antiviral medication (molnupiravir) for 2.5 more days at discharge; there was no need for the use of extended acidosis no significant infiltrates in her chest x-ray at and she was not hypoxic. -Continue to maintain adequate hydration and supportive care. -Patient mentation back to normal, oriented x3 and adequately follow commands at discharge.  UTI (urinary tract infection) -Urine culture demonstrating multiple microorganism -Patient treated with a 3 days course of Rocephin while inpatient -No dysuria frequency at discharge -Advised to keep herself well-hydrated.    Rheumatoid arthritis (HCC) - Stable overall -hold Rinvoq until follow up with PCP to allow complete recovery from COVID.  AKI (acute kidney injury) on chronic kidney disease stage IIIa (HCC) - Per chart review and GFR she has chronic kidney disease stage IIIa at baseline -Acute kidney injury In the setting of dehydration and prerenal azotemia at time of admission appreciated. -Creatinine 1.1 at discharge which is essentially patient's baseline. -Patient advised to keep herself well-hydrated and to continue avoiding nephrotoxic agents. -Repeat basic metabolic panel follow-up visit to assess electrolytes and renal function and stability.  COVID-19 virus infection - Treatment as mentioned above;Molnupiravir for 5 days. -We will also provide treatment with as needed antitussive medications, vitamin C and zinc -No steroids require in the absence of chest x-ray infiltrates and no hypoxia. -Patient advised to keep herself well-hydrated and to use Tylenol as needed for comfort.  Hypertension -Stable and well-controlled -Resume  home antihypertensive regimen -Heart healthy diet discussed with patient.  Consultants: None Procedures performed:  See below for x-ray reports Disposition: Home Diet recommendation: Heart healthy diet.  DISCHARGE MEDICATION: Allergies as of 10/16/2021   No Known Allergies      Medication List     TAKE these medications    acetaminophen 325 MG tablet Commonly known as: TYLENOL Take 2 tablets (650 mg total) by mouth every 6 (six) hours as needed for mild pain or fever.   albuterol 108 (90 Base) MCG/ACT inhaler Commonly known as: VENTOLIN HFA Inhale 2 puffs into the lungs every 8 (eight) hours as needed for wheezing or shortness of breath.   ascorbic acid 500 MG tablet Commonly known as: VITAMIN C Take 0.5 tablets (250 mg total) by mouth 2 (two) times daily.   bisoprolol 5 MG tablet Commonly known as: ZEBETA Take 5 mg by mouth daily.   calcium-vitamin D 500-5 MG-MCG tablet Commonly known as: OSCAL WITH D Take 1 tablet by mouth daily with breakfast.   dextromethorphan 30 MG/5ML liquid Commonly known as: Delsym Take 5 mLs (30 mg total) by mouth every 12 (twelve) hours as needed for cough.   famotidine 20 MG tablet Commonly known as: PEPCID Take 20 mg by mouth at bedtime as needed for heartburn.   molnupiravir EUA 200 mg Caps capsule Commonly known as: LAGEVRIO Take 4 capsules (800 mg total) by mouth 2 (two) times daily for 5 days. Hospital will provide rest of her treatment to take at home (2.5 days pending at discharge).   multivitamin with minerals Tabs tablet Take 1 tablet by mouth daily.   omega-3 acid ethyl esters 1 g capsule Commonly known as: LOVAZA Take 1 g by mouth daily.   Rinvoq 15 MG Tb24 Generic drug: Upadacitinib ER Take 15 mg by mouth daily. Hold until follow up with PCP What changed: additional instructions   torsemide 5 MG tablet Commonly known as: DEMADEX Take 5 mg by mouth daily as needed for edema.   Zinc 220 (50 Zn) MG Caps Take 220 mg by mouth daily.        Follow-up Information     Janie Morning, DO. Schedule an appointment as soon as possible  for a visit in 2 week(s).   Specialty: Family Medicine Contact information: [address] [place] [address] [phone]                Discharge Exam: Danley Danker Weights   10/14/21 0215  Weight: 68 kg   General exam: Alert, awake, oriented x 3; no using accessory muscles.  Good saturation on room air; following commands appropriately and feeling ready to go home. Respiratory system: Clear to auscultation. Respiratory effort normal.  No using accessory muscles. Cardiovascular system:RRR. No rubs, gallops or JVD. Gastrointestinal system: Abdomen is nondistended, soft and nontender. No organomegaly or masses felt. Normal bowel sounds heard. Central nervous system: Alert and oriented. No focal neurological deficits. Extremities: No cyanosis or clubbing. Skin: No petechiae. Psychiatry: Judgement and insight appear normal. Mood & affect appropriate.    Condition at discharge: Stable and improved.  The results of significant diagnostics from this hospitalization (including imaging, microbiology, ancillary and laboratory) are listed below for reference.   Imaging Studies: DG Chest Port 1 View  Result Date: 10/13/2021 CLINICAL DATA:  Fever, pneumonia EXAM: PORTABLE CHEST 1 VIEW COMPARISON:  02/28/2013 FINDINGS: In lungs are clear. No pneumothorax or pleural effusion. Cardiac size is mildly enlarged, stable when accounting for semi upright positioning.  Pulmonary vascularity is normal. 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 No acute bone abnormality. IMPRESSION: No active disease. Electronically Signed   By: Fidela Salisbury M.D.   On: 10/13/2021 23:18   CT Head Wo Contrast  Result Date: 10/13/2021 CLINICAL DATA:  Delirium. EXAM: CT HEAD WITHOUT CONTRAST TECHNIQUE: Contiguous axial images were obtained from the base of the skull through the vertex without intravenous contrast. RADIATION DOSE REDUCTION: This exam was performed according to the departmental  dose-optimization program which includes automated exposure control, adjustment of the mA and/or kV according to patient size and/or use of iterative reconstruction technique. COMPARISON:  Head CT dated 02/28/2013. FINDINGS: Brain: There is mild age-related atrophy and chronic microvascular ischemic changes. There is no acute intracranial hemorrhage. No mass effect or midline shift. No extra-axial fluid collection. Vascular: No hyperdense vessel or unexpected calcification. Skull: Normal. Negative for fracture or focal lesion. Sinuses/Orbits: The visualized paranasal sinuses and mastoid air cells are clear. Old left lamina papyracea fracture. Other: None IMPRESSION: 1. No acute intracranial pathology. 2. Mild age-related atrophy and chronic microvascular ischemic changes. Electronically Signed   By: Anner Crete M.D.   On: 10/13/2021 22:11    Microbiology: Results for orders placed or performed during the hospital encounter of 10/13/21  SARS Coronavirus 2 by RT PCR (hospital order, performed in [facility] hospital lab) *cepheid single result test* Anterior Nasal Swab     Status: Abnormal   Collection Time: 10/13/21 11:31 PM   Specimen: Anterior Nasal Swab  Result Value Ref Range Status   SARS Coronavirus 2 by RT PCR POSITIVE (A) NEGATIVE Final    Comment: (NOTE) SARS-CoV-2 target nucleic acids are DETECTED  SARS-CoV-2 RNA is generally detectable in upper respiratory specimens  during the acute phase of infection.  Positive results are indicative  of the presence of the identified virus, but do not rule out bacterial infection or co-infection with other pathogens not detected by the test.  Clinical correlation with patient history and  other diagnostic information is necessary to determine patient infection status.  The expected result is negative.  Fact Sheet for Patients:   https://www.patel.info/   Fact Sheet for Healthcare Providers:    https://hall.com/    This test is not yet approved or cleared by the [place] FDA and  has been authorized for detection and/or diagnosis of SARS-CoV-2 by FDA under an Emergency Use Authorization (EUA).  This EUA will remain in effect (meaning this test can be used) for the duration of  the COVID-19 declaration under Section 564(b)(1)  of the Act, 21 U.S.C. section 360-bbb-3(b)(1), unless the authorization is terminated or revoked sooner.   Performed at [facility], [place] [address], [place], [place] [address]   Urine Culture     Status: Abnormal   Collection Time: 10/14/21  2:42 AM   Specimen: Urine, Clean Catch  Result Value Ref Range Status   Specimen Description   Final    URINE, CLEAN CATCH Performed at [facility], [place] [address], [place], [place] [address]    Special Requests   Final    NONE Performed at [facility], [place] [address] [place], [place] [address]    Culture MULTIPLE SPECIES PRESENT, SUGGEST RECOLLECTION (A)  Final   Report Status 10/15/2021 FINAL  Final    Labs: CBC: Recent Labs  Lab 10/13/21 2145  WBC 6.9  NEUTROABS 5.3  HGB 11.3*  HCT 34.2*  MCV 92.9  PLT 221   Basic  Metabolic Panel: Recent Labs  Lab 10/13/21 2145 10/14/21 0451 10/15/21 1222  NA 138 138 136  K 3.8 3.7 3.5  CL 106 106 104  CO2 21* 23 24  GLUCOSE 100* 111* 117*  BUN 22 20 15   CREATININE 1.25* 1.13* 0.93  CALCIUM 9.2 9.2 8.9   Liver Function Tests: Recent Labs  Lab 10/13/21 2145  AST 30  ALT 17  ALKPHOS 50  BILITOT 0.6  PROT 7.5  ALBUMIN 3.8   CBG: No results for input(s): "GLUCAP" in the last 168 hours.  Discharge time spent: greater than 30 minutes.  Signed: 2146, MD [facility] 10/16/2021

## 2021-11-03 DIAGNOSIS — E878 Other disorders of electrolyte and fluid balance, not elsewhere classified: Secondary | ICD-10-CM | POA: Diagnosis not present

## 2021-11-03 DIAGNOSIS — K219 Gastro-esophageal reflux disease without esophagitis: Secondary | ICD-10-CM | POA: Diagnosis not present

## 2021-11-03 DIAGNOSIS — Z87440 Personal history of urinary (tract) infections: Secondary | ICD-10-CM | POA: Diagnosis not present

## 2021-11-03 DIAGNOSIS — Z79899 Other long term (current) drug therapy: Secondary | ICD-10-CM | POA: Diagnosis not present

## 2021-11-03 DIAGNOSIS — M0579 Rheumatoid arthritis with rheumatoid factor of multiple sites without organ or systems involvement: Secondary | ICD-10-CM | POA: Diagnosis not present

## 2021-11-03 DIAGNOSIS — Z09 Encounter for follow-up examination after completed treatment for conditions other than malignant neoplasm: Secondary | ICD-10-CM | POA: Diagnosis not present

## 2021-11-03 DIAGNOSIS — Z0000 Encounter for general adult medical examination without abnormal findings: Secondary | ICD-10-CM | POA: Diagnosis not present

## 2021-11-03 DIAGNOSIS — I10 Essential (primary) hypertension: Secondary | ICD-10-CM | POA: Diagnosis not present

## 2021-11-03 DIAGNOSIS — Z23 Encounter for immunization: Secondary | ICD-10-CM | POA: Diagnosis not present

## 2021-11-04 DIAGNOSIS — E7800 Pure hypercholesterolemia, unspecified: Secondary | ICD-10-CM | POA: Diagnosis not present

## 2021-11-04 DIAGNOSIS — Z79899 Other long term (current) drug therapy: Secondary | ICD-10-CM | POA: Diagnosis not present

## 2021-11-04 DIAGNOSIS — E878 Other disorders of electrolyte and fluid balance, not elsewhere classified: Secondary | ICD-10-CM | POA: Diagnosis not present

## 2021-11-04 DIAGNOSIS — Z87440 Personal history of urinary (tract) infections: Secondary | ICD-10-CM | POA: Diagnosis not present

## 2021-11-04 DIAGNOSIS — I10 Essential (primary) hypertension: Secondary | ICD-10-CM | POA: Diagnosis not present

## 2021-12-15 DIAGNOSIS — M0579 Rheumatoid arthritis with rheumatoid factor of multiple sites without organ or systems involvement: Secondary | ICD-10-CM | POA: Diagnosis not present

## 2021-12-22 DIAGNOSIS — Z862 Personal history of diseases of the blood and blood-forming organs and certain disorders involving the immune mechanism: Secondary | ICD-10-CM | POA: Diagnosis not present

## 2021-12-22 DIAGNOSIS — N1831 Chronic kidney disease, stage 3a: Secondary | ICD-10-CM | POA: Diagnosis not present

## 2022-01-11 DIAGNOSIS — M0579 Rheumatoid arthritis with rheumatoid factor of multiple sites without organ or systems involvement: Secondary | ICD-10-CM | POA: Diagnosis not present

## 2022-03-08 DIAGNOSIS — R58 Hemorrhage, not elsewhere classified: Secondary | ICD-10-CM | POA: Diagnosis not present

## 2022-03-09 DIAGNOSIS — R58 Hemorrhage, not elsewhere classified: Secondary | ICD-10-CM | POA: Diagnosis not present

## 2022-03-10 DIAGNOSIS — Z1231 Encounter for screening mammogram for malignant neoplasm of breast: Secondary | ICD-10-CM | POA: Diagnosis not present

## 2022-03-11 ENCOUNTER — Encounter: Payer: Self-pay | Admitting: Family Medicine

## 2022-03-14 ENCOUNTER — Other Ambulatory Visit: Payer: Self-pay | Admitting: Family Medicine

## 2022-03-14 DIAGNOSIS — R945 Abnormal results of liver function studies: Secondary | ICD-10-CM

## 2022-03-14 DIAGNOSIS — R1084 Generalized abdominal pain: Secondary | ICD-10-CM

## 2022-03-14 DIAGNOSIS — Z862 Personal history of diseases of the blood and blood-forming organs and certain disorders involving the immune mechanism: Secondary | ICD-10-CM

## 2022-03-14 DIAGNOSIS — N939 Abnormal uterine and vaginal bleeding, unspecified: Secondary | ICD-10-CM

## 2022-03-21 DIAGNOSIS — Z13220 Encounter for screening for lipoid disorders: Secondary | ICD-10-CM | POA: Diagnosis not present

## 2022-03-21 DIAGNOSIS — M1991 Primary osteoarthritis, unspecified site: Secondary | ICD-10-CM | POA: Diagnosis not present

## 2022-03-21 DIAGNOSIS — R600 Localized edema: Secondary | ICD-10-CM | POA: Diagnosis not present

## 2022-03-21 DIAGNOSIS — M0579 Rheumatoid arthritis with rheumatoid factor of multiple sites without organ or systems involvement: Secondary | ICD-10-CM | POA: Diagnosis not present

## 2022-03-21 DIAGNOSIS — E663 Overweight: Secondary | ICD-10-CM | POA: Diagnosis not present

## 2022-03-21 DIAGNOSIS — Z6826 Body mass index (BMI) 26.0-26.9, adult: Secondary | ICD-10-CM | POA: Diagnosis not present

## 2022-03-23 DIAGNOSIS — N281 Cyst of kidney, acquired: Secondary | ICD-10-CM | POA: Diagnosis not present

## 2022-03-23 DIAGNOSIS — K8020 Calculus of gallbladder without cholecystitis without obstruction: Secondary | ICD-10-CM | POA: Diagnosis not present

## 2022-03-23 DIAGNOSIS — N858 Other specified noninflammatory disorders of uterus: Secondary | ICD-10-CM | POA: Diagnosis not present

## 2022-03-23 DIAGNOSIS — D649 Anemia, unspecified: Secondary | ICD-10-CM | POA: Diagnosis not present

## 2022-04-01 DIAGNOSIS — R9389 Abnormal findings on diagnostic imaging of other specified body structures: Secondary | ICD-10-CM | POA: Diagnosis not present

## 2022-04-01 DIAGNOSIS — N858 Other specified noninflammatory disorders of uterus: Secondary | ICD-10-CM | POA: Diagnosis not present

## 2022-04-01 DIAGNOSIS — N939 Abnormal uterine and vaginal bleeding, unspecified: Secondary | ICD-10-CM | POA: Diagnosis not present

## 2022-04-14 DIAGNOSIS — N950 Postmenopausal bleeding: Secondary | ICD-10-CM | POA: Diagnosis not present

## 2022-04-26 DIAGNOSIS — N950 Postmenopausal bleeding: Secondary | ICD-10-CM | POA: Diagnosis not present

## 2022-04-27 DIAGNOSIS — E878 Other disorders of electrolyte and fluid balance, not elsewhere classified: Secondary | ICD-10-CM | POA: Diagnosis not present

## 2022-04-27 DIAGNOSIS — I10 Essential (primary) hypertension: Secondary | ICD-10-CM | POA: Diagnosis not present

## 2022-04-27 DIAGNOSIS — Z87440 Personal history of urinary (tract) infections: Secondary | ICD-10-CM | POA: Diagnosis not present

## 2022-04-27 DIAGNOSIS — Z79899 Other long term (current) drug therapy: Secondary | ICD-10-CM | POA: Diagnosis not present

## 2022-05-04 DIAGNOSIS — M810 Age-related osteoporosis without current pathological fracture: Secondary | ICD-10-CM | POA: Diagnosis not present

## 2022-05-04 DIAGNOSIS — I10 Essential (primary) hypertension: Secondary | ICD-10-CM | POA: Diagnosis not present

## 2022-05-04 DIAGNOSIS — D649 Anemia, unspecified: Secondary | ICD-10-CM | POA: Diagnosis not present

## 2022-05-04 DIAGNOSIS — E7800 Pure hypercholesterolemia, unspecified: Secondary | ICD-10-CM | POA: Diagnosis not present

## 2022-05-04 DIAGNOSIS — M0579 Rheumatoid arthritis with rheumatoid factor of multiple sites without organ or systems involvement: Secondary | ICD-10-CM | POA: Diagnosis not present

## 2022-05-04 DIAGNOSIS — E039 Hypothyroidism, unspecified: Secondary | ICD-10-CM | POA: Diagnosis not present

## 2022-05-04 DIAGNOSIS — R7309 Other abnormal glucose: Secondary | ICD-10-CM | POA: Diagnosis not present

## 2022-05-04 DIAGNOSIS — K219 Gastro-esophageal reflux disease without esophagitis: Secondary | ICD-10-CM | POA: Diagnosis not present

## 2022-06-29 DIAGNOSIS — E663 Overweight: Secondary | ICD-10-CM | POA: Diagnosis not present

## 2022-06-29 DIAGNOSIS — Z6826 Body mass index (BMI) 26.0-26.9, adult: Secondary | ICD-10-CM | POA: Diagnosis not present

## 2022-06-29 DIAGNOSIS — M0579 Rheumatoid arthritis with rheumatoid factor of multiple sites without organ or systems involvement: Secondary | ICD-10-CM | POA: Diagnosis not present

## 2022-06-29 DIAGNOSIS — R600 Localized edema: Secondary | ICD-10-CM | POA: Diagnosis not present

## 2022-06-29 DIAGNOSIS — M1991 Primary osteoarthritis, unspecified site: Secondary | ICD-10-CM | POA: Diagnosis not present

## 2022-06-29 DIAGNOSIS — Z13220 Encounter for screening for lipoid disorders: Secondary | ICD-10-CM | POA: Diagnosis not present

## 2022-08-04 DIAGNOSIS — K625 Hemorrhage of anus and rectum: Secondary | ICD-10-CM | POA: Diagnosis not present

## 2022-08-10 DIAGNOSIS — K640 First degree hemorrhoids: Secondary | ICD-10-CM | POA: Diagnosis not present

## 2022-08-10 DIAGNOSIS — K625 Hemorrhage of anus and rectum: Secondary | ICD-10-CM | POA: Diagnosis not present

## 2022-08-10 DIAGNOSIS — K5730 Diverticulosis of large intestine without perforation or abscess without bleeding: Secondary | ICD-10-CM | POA: Diagnosis not present

## 2022-10-13 DIAGNOSIS — Z6827 Body mass index (BMI) 27.0-27.9, adult: Secondary | ICD-10-CM | POA: Diagnosis not present

## 2022-10-13 DIAGNOSIS — K625 Hemorrhage of anus and rectum: Secondary | ICD-10-CM | POA: Diagnosis not present

## 2022-10-13 DIAGNOSIS — R600 Localized edema: Secondary | ICD-10-CM | POA: Diagnosis not present

## 2022-10-13 DIAGNOSIS — M1991 Primary osteoarthritis, unspecified site: Secondary | ICD-10-CM | POA: Diagnosis not present

## 2022-10-13 DIAGNOSIS — M0579 Rheumatoid arthritis with rheumatoid factor of multiple sites without organ or systems involvement: Secondary | ICD-10-CM | POA: Diagnosis not present

## 2022-10-13 DIAGNOSIS — E663 Overweight: Secondary | ICD-10-CM | POA: Diagnosis not present

## 2022-10-21 DIAGNOSIS — Z23 Encounter for immunization: Secondary | ICD-10-CM | POA: Diagnosis not present

## 2022-10-31 DIAGNOSIS — D649 Anemia, unspecified: Secondary | ICD-10-CM | POA: Diagnosis not present

## 2022-10-31 DIAGNOSIS — M810 Age-related osteoporosis without current pathological fracture: Secondary | ICD-10-CM | POA: Diagnosis not present

## 2022-10-31 DIAGNOSIS — E7800 Pure hypercholesterolemia, unspecified: Secondary | ICD-10-CM | POA: Diagnosis not present

## 2022-10-31 DIAGNOSIS — K219 Gastro-esophageal reflux disease without esophagitis: Secondary | ICD-10-CM | POA: Diagnosis not present

## 2022-10-31 DIAGNOSIS — R7309 Other abnormal glucose: Secondary | ICD-10-CM | POA: Diagnosis not present

## 2022-10-31 DIAGNOSIS — E039 Hypothyroidism, unspecified: Secondary | ICD-10-CM | POA: Diagnosis not present

## 2022-10-31 DIAGNOSIS — I10 Essential (primary) hypertension: Secondary | ICD-10-CM | POA: Diagnosis not present

## 2022-11-07 DIAGNOSIS — K219 Gastro-esophageal reflux disease without esophagitis: Secondary | ICD-10-CM | POA: Diagnosis not present

## 2022-11-07 DIAGNOSIS — M810 Age-related osteoporosis without current pathological fracture: Secondary | ICD-10-CM | POA: Diagnosis not present

## 2022-11-07 DIAGNOSIS — Z0000 Encounter for general adult medical examination without abnormal findings: Secondary | ICD-10-CM | POA: Diagnosis not present

## 2022-11-07 DIAGNOSIS — E7800 Pure hypercholesterolemia, unspecified: Secondary | ICD-10-CM | POA: Diagnosis not present

## 2022-11-07 DIAGNOSIS — I129 Hypertensive chronic kidney disease with stage 1 through stage 4 chronic kidney disease, or unspecified chronic kidney disease: Secondary | ICD-10-CM | POA: Diagnosis not present

## 2022-11-07 DIAGNOSIS — M0579 Rheumatoid arthritis with rheumatoid factor of multiple sites without organ or systems involvement: Secondary | ICD-10-CM | POA: Diagnosis not present

## 2022-11-07 DIAGNOSIS — N1831 Chronic kidney disease, stage 3a: Secondary | ICD-10-CM | POA: Diagnosis not present

## 2022-11-07 DIAGNOSIS — Z862 Personal history of diseases of the blood and blood-forming organs and certain disorders involving the immune mechanism: Secondary | ICD-10-CM | POA: Diagnosis not present

## 2023-01-17 DIAGNOSIS — K625 Hemorrhage of anus and rectum: Secondary | ICD-10-CM | POA: Diagnosis not present

## 2023-01-17 DIAGNOSIS — Z13220 Encounter for screening for lipoid disorders: Secondary | ICD-10-CM | POA: Diagnosis not present

## 2023-01-17 DIAGNOSIS — R600 Localized edema: Secondary | ICD-10-CM | POA: Diagnosis not present

## 2023-01-17 DIAGNOSIS — Z111 Encounter for screening for respiratory tuberculosis: Secondary | ICD-10-CM | POA: Diagnosis not present

## 2023-01-17 DIAGNOSIS — M1991 Primary osteoarthritis, unspecified site: Secondary | ICD-10-CM | POA: Diagnosis not present

## 2023-01-17 DIAGNOSIS — Z6828 Body mass index (BMI) 28.0-28.9, adult: Secondary | ICD-10-CM | POA: Diagnosis not present

## 2023-01-17 DIAGNOSIS — M0579 Rheumatoid arthritis with rheumatoid factor of multiple sites without organ or systems involvement: Secondary | ICD-10-CM | POA: Diagnosis not present

## 2023-01-17 DIAGNOSIS — E663 Overweight: Secondary | ICD-10-CM | POA: Diagnosis not present

## 2023-03-16 DIAGNOSIS — Z1231 Encounter for screening mammogram for malignant neoplasm of breast: Secondary | ICD-10-CM | POA: Diagnosis not present

## 2023-04-18 DIAGNOSIS — E663 Overweight: Secondary | ICD-10-CM | POA: Diagnosis not present

## 2023-04-18 DIAGNOSIS — M1991 Primary osteoarthritis, unspecified site: Secondary | ICD-10-CM | POA: Diagnosis not present

## 2023-04-18 DIAGNOSIS — Z6828 Body mass index (BMI) 28.0-28.9, adult: Secondary | ICD-10-CM | POA: Diagnosis not present

## 2023-04-18 DIAGNOSIS — M0579 Rheumatoid arthritis with rheumatoid factor of multiple sites without organ or systems involvement: Secondary | ICD-10-CM | POA: Diagnosis not present

## 2023-04-18 DIAGNOSIS — Z13220 Encounter for screening for lipoid disorders: Secondary | ICD-10-CM | POA: Diagnosis not present

## 2023-04-18 DIAGNOSIS — R600 Localized edema: Secondary | ICD-10-CM | POA: Diagnosis not present

## 2023-04-18 DIAGNOSIS — K625 Hemorrhage of anus and rectum: Secondary | ICD-10-CM | POA: Diagnosis not present

## 2023-05-01 DIAGNOSIS — E7800 Pure hypercholesterolemia, unspecified: Secondary | ICD-10-CM | POA: Diagnosis not present

## 2023-05-08 DIAGNOSIS — M0579 Rheumatoid arthritis with rheumatoid factor of multiple sites without organ or systems involvement: Secondary | ICD-10-CM | POA: Diagnosis not present

## 2023-05-08 DIAGNOSIS — I129 Hypertensive chronic kidney disease with stage 1 through stage 4 chronic kidney disease, or unspecified chronic kidney disease: Secondary | ICD-10-CM | POA: Diagnosis not present

## 2023-05-08 DIAGNOSIS — N1831 Chronic kidney disease, stage 3a: Secondary | ICD-10-CM | POA: Diagnosis not present

## 2023-05-08 DIAGNOSIS — K219 Gastro-esophageal reflux disease without esophagitis: Secondary | ICD-10-CM | POA: Diagnosis not present

## 2023-05-08 DIAGNOSIS — E7800 Pure hypercholesterolemia, unspecified: Secondary | ICD-10-CM | POA: Diagnosis not present

## 2023-07-31 DIAGNOSIS — M1991 Primary osteoarthritis, unspecified site: Secondary | ICD-10-CM | POA: Diagnosis not present

## 2023-07-31 DIAGNOSIS — Z13220 Encounter for screening for lipoid disorders: Secondary | ICD-10-CM | POA: Diagnosis not present

## 2023-07-31 DIAGNOSIS — E663 Overweight: Secondary | ICD-10-CM | POA: Diagnosis not present

## 2023-07-31 DIAGNOSIS — Z6828 Body mass index (BMI) 28.0-28.9, adult: Secondary | ICD-10-CM | POA: Diagnosis not present

## 2023-07-31 DIAGNOSIS — K625 Hemorrhage of anus and rectum: Secondary | ICD-10-CM | POA: Diagnosis not present

## 2023-07-31 DIAGNOSIS — M0579 Rheumatoid arthritis with rheumatoid factor of multiple sites without organ or systems involvement: Secondary | ICD-10-CM | POA: Diagnosis not present

## 2023-07-31 DIAGNOSIS — R600 Localized edema: Secondary | ICD-10-CM | POA: Diagnosis not present

## 2023-10-30 DIAGNOSIS — M0579 Rheumatoid arthritis with rheumatoid factor of multiple sites without organ or systems involvement: Secondary | ICD-10-CM | POA: Diagnosis not present

## 2023-11-08 DIAGNOSIS — Z862 Personal history of diseases of the blood and blood-forming organs and certain disorders involving the immune mechanism: Secondary | ICD-10-CM | POA: Diagnosis not present

## 2023-11-08 DIAGNOSIS — I129 Hypertensive chronic kidney disease with stage 1 through stage 4 chronic kidney disease, or unspecified chronic kidney disease: Secondary | ICD-10-CM | POA: Diagnosis not present

## 2023-11-08 DIAGNOSIS — R7303 Prediabetes: Secondary | ICD-10-CM | POA: Diagnosis not present

## 2023-11-08 DIAGNOSIS — Z0000 Encounter for general adult medical examination without abnormal findings: Secondary | ICD-10-CM | POA: Diagnosis not present

## 2023-11-08 DIAGNOSIS — Z23 Encounter for immunization: Secondary | ICD-10-CM | POA: Diagnosis not present

## 2023-11-08 DIAGNOSIS — K219 Gastro-esophageal reflux disease without esophagitis: Secondary | ICD-10-CM | POA: Diagnosis not present

## 2023-11-08 DIAGNOSIS — E7800 Pure hypercholesterolemia, unspecified: Secondary | ICD-10-CM | POA: Diagnosis not present

## 2023-11-08 DIAGNOSIS — M810 Age-related osteoporosis without current pathological fracture: Secondary | ICD-10-CM | POA: Diagnosis not present

## 2023-11-08 DIAGNOSIS — N1831 Chronic kidney disease, stage 3a: Secondary | ICD-10-CM | POA: Diagnosis not present

## 2023-11-08 DIAGNOSIS — M0579 Rheumatoid arthritis with rheumatoid factor of multiple sites without organ or systems involvement: Secondary | ICD-10-CM | POA: Diagnosis not present

## 2023-12-25 DIAGNOSIS — Z23 Encounter for immunization: Secondary | ICD-10-CM | POA: Diagnosis not present

## 2024-01-22 DIAGNOSIS — M0579 Rheumatoid arthritis with rheumatoid factor of multiple sites without organ or systems involvement: Secondary | ICD-10-CM | POA: Diagnosis not present

## 2024-01-22 DIAGNOSIS — Z13220 Encounter for screening for lipoid disorders: Secondary | ICD-10-CM | POA: Diagnosis not present

## 2024-01-22 DIAGNOSIS — K625 Hemorrhage of anus and rectum: Secondary | ICD-10-CM | POA: Diagnosis not present

## 2024-01-22 DIAGNOSIS — R600 Localized edema: Secondary | ICD-10-CM | POA: Diagnosis not present

## 2024-01-22 DIAGNOSIS — Z111 Encounter for screening for respiratory tuberculosis: Secondary | ICD-10-CM | POA: Diagnosis not present

## 2024-01-22 DIAGNOSIS — M1991 Primary osteoarthritis, unspecified site: Secondary | ICD-10-CM | POA: Diagnosis not present

## 2024-01-22 DIAGNOSIS — E663 Overweight: Secondary | ICD-10-CM | POA: Diagnosis not present

## 2024-01-22 DIAGNOSIS — Z6828 Body mass index (BMI) 28.0-28.9, adult: Secondary | ICD-10-CM | POA: Diagnosis not present
